# Patient Record
Sex: Female | Born: 1940 | Race: White | Hispanic: No | Marital: Married | Smoking: Former smoker
Health system: Southern US, Community
[De-identification: ages and names within clinical notes are randomized; demographics above are authoritative.]

## PROBLEM LIST (undated history)

## (undated) DIAGNOSIS — I10 Essential (primary) hypertension: Secondary | ICD-10-CM

## (undated) DIAGNOSIS — E039 Hypothyroidism, unspecified: Secondary | ICD-10-CM

## (undated) DIAGNOSIS — I4891 Unspecified atrial fibrillation: Secondary | ICD-10-CM

## (undated) DIAGNOSIS — G459 Transient cerebral ischemic attack, unspecified: Secondary | ICD-10-CM

## (undated) DIAGNOSIS — I739 Peripheral vascular disease, unspecified: Secondary | ICD-10-CM

## (undated) HISTORY — PX: OTHER SURGICAL HISTORY: SHX169

## (undated) HISTORY — PX: THYROIDECTOMY: SHX17

## (undated) HISTORY — PX: BREAST BIOPSY: SHX20

---

## 1976-08-21 HISTORY — PX: TOTAL THYROIDECTOMY: SHX2547

## 1978-08-21 HISTORY — PX: ABDOMINAL HYSTERECTOMY: SHX81

## 1978-08-21 HISTORY — PX: APPENDECTOMY: SHX54

## 1978-08-21 HISTORY — PX: BREAST BIOPSY: SHX20

## 2005-05-04 ENCOUNTER — Ambulatory Visit: Payer: Self-pay | Admitting: Dermatology

## 2005-09-27 ENCOUNTER — Ambulatory Visit: Payer: Self-pay | Admitting: Unknown Physician Specialty

## 2005-10-19 ENCOUNTER — Ambulatory Visit: Payer: Self-pay | Admitting: Unknown Physician Specialty

## 2005-11-15 ENCOUNTER — Ambulatory Visit: Payer: Self-pay | Admitting: General Surgery

## 2008-05-27 ENCOUNTER — Ambulatory Visit: Payer: Self-pay | Admitting: Unknown Physician Specialty

## 2010-06-23 ENCOUNTER — Other Ambulatory Visit: Payer: Self-pay | Admitting: Rheumatology

## 2010-07-07 ENCOUNTER — Ambulatory Visit: Payer: Self-pay | Admitting: Unknown Physician Specialty

## 2011-09-20 ENCOUNTER — Ambulatory Visit: Payer: Self-pay | Admitting: Family Medicine

## 2013-02-26 ENCOUNTER — Ambulatory Visit: Payer: Self-pay | Admitting: Family Medicine

## 2013-02-26 LAB — HM MAMMOGRAPHY

## 2013-06-11 ENCOUNTER — Observation Stay: Payer: Self-pay | Admitting: Internal Medicine

## 2013-06-11 LAB — COMPREHENSIVE METABOLIC PANEL
Albumin: 3.8 g/dL (ref 3.4–5.0)
Alkaline Phosphatase: 83 U/L (ref 50–136)
Anion Gap: 8 (ref 7–16)
BUN: 30 mg/dL — ABNORMAL HIGH (ref 7–18)
Creatinine: 1.81 mg/dL — ABNORMAL HIGH (ref 0.60–1.30)
EGFR (African American): 31 — ABNORMAL LOW
EGFR (Non-African Amer.): 27 — ABNORMAL LOW
Osmolality: 278 (ref 275–301)
Potassium: 3.9 mmol/L (ref 3.5–5.1)
SGPT (ALT): 26 U/L (ref 12–78)
Total Protein: 7.4 g/dL (ref 6.4–8.2)

## 2013-06-11 LAB — CBC
HCT: 43.6 % (ref 35.0–47.0)
MCH: 33.7 pg (ref 26.0–34.0)
MCHC: 34.7 g/dL (ref 32.0–36.0)
MCV: 97 fL (ref 80–100)
Platelet: 279 10*3/uL (ref 150–440)
RBC: 4.49 10*6/uL (ref 3.80–5.20)
WBC: 6.8 10*3/uL (ref 3.6–11.0)

## 2013-06-12 LAB — LIPID PANEL
HDL Cholesterol: 56 mg/dL (ref 40–60)
Triglycerides: 152 mg/dL (ref 0–200)
VLDL Cholesterol, Calc: 30 mg/dL (ref 5–40)

## 2013-06-12 LAB — TSH: Thyroid Stimulating Horm: 0.461 u[IU]/mL

## 2013-08-28 ENCOUNTER — Ambulatory Visit: Payer: Self-pay | Admitting: Internal Medicine

## 2013-09-11 DIAGNOSIS — J449 Chronic obstructive pulmonary disease, unspecified: Secondary | ICD-10-CM | POA: Insufficient documentation

## 2013-09-11 DIAGNOSIS — M109 Gout, unspecified: Secondary | ICD-10-CM | POA: Insufficient documentation

## 2013-09-11 DIAGNOSIS — N189 Chronic kidney disease, unspecified: Secondary | ICD-10-CM | POA: Insufficient documentation

## 2013-09-11 DIAGNOSIS — I38 Endocarditis, valve unspecified: Secondary | ICD-10-CM | POA: Insufficient documentation

## 2013-09-11 DIAGNOSIS — I739 Peripheral vascular disease, unspecified: Secondary | ICD-10-CM | POA: Insufficient documentation

## 2013-09-29 DIAGNOSIS — I482 Chronic atrial fibrillation, unspecified: Secondary | ICD-10-CM | POA: Insufficient documentation

## 2013-11-13 DIAGNOSIS — Z9229 Personal history of other drug therapy: Secondary | ICD-10-CM | POA: Insufficient documentation

## 2013-11-25 DIAGNOSIS — D510 Vitamin B12 deficiency anemia due to intrinsic factor deficiency: Secondary | ICD-10-CM | POA: Insufficient documentation

## 2014-01-05 ENCOUNTER — Encounter: Payer: Self-pay | Admitting: Nurse Practitioner

## 2014-01-19 ENCOUNTER — Encounter: Payer: Self-pay | Admitting: Nurse Practitioner

## 2014-05-11 LAB — BASIC METABOLIC PANEL
BUN: 21 mg/dL (ref 4–21)
CREATININE: 1.6 mg/dL — AB (ref <=1.1)
Glucose: 101 mg/dL
Potassium: 5.2 mmol/L (ref 3.4–5.3)
SODIUM: 140 mmol/L (ref 137–147)

## 2014-05-11 LAB — LIPID PANEL
Cholesterol: 205 mg/dL — AB (ref 0–200)
HDL: 98 mg/dL — AB (ref 35–70)
LDL CALC: 86 mg/dL
Triglycerides: 107 mg/dL (ref 40–160)

## 2014-05-11 LAB — HEPATIC FUNCTION PANEL
ALT: 19 U/L (ref 7–35)
AST: 26 U/L (ref 13–35)

## 2014-05-11 LAB — CBC AND DIFFERENTIAL
HCT: 36 % (ref 36–46)
Hemoglobin: 11.9 g/dL — AB (ref 12.0–16.0)
Platelets: 299 10*3/uL (ref 150–399)
WBC: 5.4 10*3/mL

## 2014-05-11 LAB — TSH: TSH: 8.38 u[IU]/mL — AB (ref <=5.90)

## 2014-05-11 LAB — HEMOGLOBIN A1C: Hgb A1c MFr Bld: 5.3 % (ref 4.0–6.0)

## 2014-07-01 ENCOUNTER — Ambulatory Visit: Payer: Self-pay | Admitting: Family Medicine

## 2014-07-01 LAB — HM DEXA SCAN

## 2014-10-28 DIAGNOSIS — I340 Nonrheumatic mitral (valve) insufficiency: Secondary | ICD-10-CM | POA: Insufficient documentation

## 2014-10-28 DIAGNOSIS — I071 Rheumatic tricuspid insufficiency: Secondary | ICD-10-CM | POA: Insufficient documentation

## 2014-11-12 ENCOUNTER — Inpatient Hospital Stay: Payer: Self-pay | Admitting: Internal Medicine

## 2014-11-12 LAB — URINALYSIS, COMPLETE
Bacteria: NONE SEEN
Bilirubin,UR: NEGATIVE
Blood: NEGATIVE
Glucose,UR: NEGATIVE mg/dL (ref 0–75)
KETONE: NEGATIVE
Leukocyte Esterase: NEGATIVE
Nitrite: NEGATIVE
PH: 7 (ref 4.5–8.0)
Protein: 100
RBC,UR: 1 /HPF (ref 0–5)
SPECIFIC GRAVITY: 1.012 (ref 1.003–1.030)
SQUAMOUS EPITHELIAL: NONE SEEN
WBC UR: 1 /HPF (ref 0–5)

## 2014-11-12 LAB — COMPREHENSIVE METABOLIC PANEL
AST: 23 U/L
Albumin: 3.9 g/dL
Alkaline Phosphatase: 69 U/L
Anion Gap: 10 (ref 7–16)
BUN: 21 mg/dL — AB
Bilirubin,Total: 0.2 mg/dL — ABNORMAL LOW
Calcium, Total: 9 mg/dL
Chloride: 105 mmol/L
Co2: 24 mmol/L
Creatinine: 1.26 mg/dL — ABNORMAL HIGH
EGFR (Non-African Amer.): 42 — ABNORMAL LOW
GFR CALC AF AMER: 48 — AB
Glucose: 129 mg/dL — ABNORMAL HIGH
POTASSIUM: 4.1 mmol/L
SGPT (ALT): 17 U/L
Sodium: 139 mmol/L
TOTAL PROTEIN: 6.8 g/dL

## 2014-11-12 LAB — CBC WITH DIFFERENTIAL/PLATELET
Basophil #: 0.1 10*3/uL (ref 0.0–0.1)
Basophil %: 1.8 %
EOS PCT: 4.6 %
Eosinophil #: 0.3 10*3/uL (ref 0.0–0.7)
HCT: 22.8 % — AB (ref 35.0–47.0)
HGB: 6.7 g/dL — ABNORMAL LOW (ref 12.0–16.0)
LYMPHS ABS: 0.5 10*3/uL — AB (ref 1.0–3.6)
Lymphocyte %: 8.6 %
MCH: 21.2 pg — AB (ref 26.0–34.0)
MCHC: 29.3 g/dL — ABNORMAL LOW (ref 32.0–36.0)
MCV: 72 fL — ABNORMAL LOW (ref 80–100)
MONO ABS: 0.5 x10 3/mm (ref 0.2–0.9)
MONOS PCT: 9 %
NEUTROS ABS: 4.6 10*3/uL (ref 1.4–6.5)
Neutrophil %: 76 %
Platelet: 330 10*3/uL (ref 150–440)
RBC: 3.15 10*6/uL — AB (ref 3.80–5.20)
RDW: 17.5 % — ABNORMAL HIGH (ref 11.5–14.5)
WBC: 6 10*3/uL (ref 3.6–11.0)

## 2014-11-13 LAB — CBC WITH DIFFERENTIAL/PLATELET
BASOS PCT: 1.4 %
Basophil #: 0.1 10*3/uL (ref 0.0–0.1)
EOS PCT: 1.8 %
Eosinophil #: 0.1 10*3/uL (ref 0.0–0.7)
HCT: 23.4 % — ABNORMAL LOW (ref 35.0–47.0)
HGB: 7.2 g/dL — AB (ref 12.0–16.0)
Lymphocyte #: 0.4 10*3/uL — ABNORMAL LOW (ref 1.0–3.6)
Lymphocyte %: 5.3 %
MCH: 21.9 pg — AB (ref 26.0–34.0)
MCHC: 30.7 g/dL — ABNORMAL LOW (ref 32.0–36.0)
MCV: 71 fL — AB (ref 80–100)
MONO ABS: 0.7 x10 3/mm (ref 0.2–0.9)
Monocyte %: 9.7 %
NEUTROS ABS: 6 10*3/uL (ref 1.4–6.5)
Neutrophil %: 81.8 %
Platelet: 287 10*3/uL (ref 150–440)
RBC: 3.29 10*6/uL — AB (ref 3.80–5.20)
RDW: 17.7 % — ABNORMAL HIGH (ref 11.5–14.5)
WBC: 7.3 10*3/uL (ref 3.6–11.0)

## 2014-11-13 LAB — BASIC METABOLIC PANEL
ANION GAP: 7 (ref 7–16)
BUN: 18 mg/dL
CALCIUM: 8.8 mg/dL — AB
CHLORIDE: 105 mmol/L
Co2: 23 mmol/L
Creatinine: 1.26 mg/dL — ABNORMAL HIGH
EGFR (African American): 48 — ABNORMAL LOW
GFR CALC NON AF AMER: 42 — AB
GLUCOSE: 107 mg/dL — AB
Potassium: 3.9 mmol/L
Sodium: 135 mmol/L

## 2014-11-13 LAB — HEMOGLOBIN: HGB: 8 g/dL — AB (ref 12.0–16.0)

## 2014-11-14 LAB — HEMOGLOBIN: HGB: 8 g/dL — ABNORMAL LOW (ref 12.0–16.0)

## 2014-11-15 LAB — CBC WITH DIFFERENTIAL/PLATELET
BASOS ABS: 0 10*3/uL (ref 0.0–0.1)
Basophil %: 0.6 %
EOS ABS: 0 10*3/uL (ref 0.0–0.7)
Eosinophil %: 0 %
HCT: 26.6 % — ABNORMAL LOW (ref 35.0–47.0)
HGB: 8.1 g/dL — ABNORMAL LOW (ref 12.0–16.0)
LYMPHS ABS: 0.4 10*3/uL — AB (ref 1.0–3.6)
LYMPHS PCT: 5.3 %
MCH: 22.3 pg — ABNORMAL LOW (ref 26.0–34.0)
MCHC: 30.5 g/dL — ABNORMAL LOW (ref 32.0–36.0)
MCV: 73 fL — ABNORMAL LOW (ref 80–100)
MONOS PCT: 3.3 %
Monocyte #: 0.2 x10 3/mm (ref 0.2–0.9)
NEUTROS ABS: 6.8 10*3/uL — AB (ref 1.4–6.5)
Neutrophil %: 90.8 %
PLATELETS: 342 10*3/uL (ref 150–440)
RBC: 3.65 10*6/uL — ABNORMAL LOW (ref 3.80–5.20)
RDW: 18.8 % — AB (ref 11.5–14.5)
WBC: 7.5 10*3/uL (ref 3.6–11.0)

## 2014-11-15 LAB — BASIC METABOLIC PANEL
Anion Gap: 10 (ref 7–16)
BUN: 28 mg/dL — AB
CHLORIDE: 101 mmol/L
CREATININE: 1.77 mg/dL — AB
Calcium, Total: 8.7 mg/dL — ABNORMAL LOW
Co2: 21 mmol/L — ABNORMAL LOW
EGFR (African American): 32 — ABNORMAL LOW
EGFR (Non-African Amer.): 28 — ABNORMAL LOW
GLUCOSE: 183 mg/dL — AB
Potassium: 4 mmol/L
SODIUM: 132 mmol/L — AB

## 2014-11-16 LAB — BASIC METABOLIC PANEL
ANION GAP: 8 (ref 7–16)
BUN: 40 mg/dL — AB
CALCIUM: 8.4 mg/dL — AB
CHLORIDE: 104 mmol/L
Co2: 23 mmol/L
Creatinine: 2.31 mg/dL — ABNORMAL HIGH
EGFR (Non-African Amer.): 20 — ABNORMAL LOW
GFR CALC AF AMER: 23 — AB
GLUCOSE: 101 mg/dL — AB
POTASSIUM: 4.1 mmol/L
SODIUM: 135 mmol/L

## 2014-11-16 LAB — CREATININE, SERUM
Creatinine: 2.11 mg/dL — ABNORMAL HIGH
GFR CALC AF AMER: 26 — AB
GFR CALC NON AF AMER: 22 — AB

## 2014-11-16 LAB — HEMOGLOBIN: HGB: 7.8 g/dL — AB (ref 12.0–16.0)

## 2014-11-17 LAB — CULTURE, BLOOD (SINGLE)

## 2014-12-11 NOTE — Consult Note (Signed)
Referring Physician:  Lytle Butte   Primary Care Physician:  [facility], [address] [place], [place] [address], [place] [phone]  Reason for Consult: Admit Date: 11-Jun-2013  Chief Complaint: L facial droop  Reason for Consult: transient neurologic deficit   History of Present Illness: History of Present Illness:   74 yo RHD F presents to [facility] secondary to not feeling right for the past 2 days and headache.  Yesterday she was eating dinner and noted that she could not get her words out.  This lasted only a few seconds but then family noted that she had a L facial droop that lasted for a few hours.  There was no focal weakness, numbness, vision changes or tingling.  No incontinence, no photophobia, no tongue biting and no N/V.  ROS:  General denies complaints   HEENT no complaints   Lungs no complaints   Cardiac no complaints   GI no complaints   GU no complaints   Musculoskeletal no complaints   Extremities no complaints   Skin no complaints   Neuro headache   Endocrine no complaints   Psych no complaints   Past Medical/Surgical Hx:  Hypertension:   Thyroidectomy:   Hysterectomy:   Past Medical/ Surgical Hx:  Past Medical History HTN, HLD, remote migraines, PVD   Past Surgical History as above   Home Medications: Medication Instructions Last Modified Date/Time  Plavix 75 mg oral tablet 1 tab(s) orally once a day 22-Oct-14 22:58  Synthroid  orally  22-Oct-14 22:58  Uloric  orally  22-Oct-14 22:58  hydrochlorothiazide  orally  22-Oct-14 22:58   Allergies:  Allopurinol: Hives  Allergies:  Allergies as above   Social/Family History: Employment Status: retired  Lives With: spouse  Living Arrangements: house  Social History: 2-3 glasses of wine/daily, no tob, no illicits  Family History: no seizures or strokes   Vital Signs: **Vital Signs.:   23-Oct-14 11:45  Vital Signs Type Routine  Temperature  Temperature (F) 97.9  Celsius 36.6  Temperature Source oral  Pulse Pulse 105  Respirations Respirations 18  Systolic BP Systolic BP 644  Diastolic BP (mmHg) Diastolic BP (mmHg) 91  Mean BP 105  Pulse Ox % Pulse Ox % 97  Pulse Ox Activity Level  At rest  Oxygen Delivery Room Air/ 21 %   Physical Exam: General: NAD, resting comfortable, slightly overweight  HEENT: normocephalic, sclera nonicteric, oropharynx clear  Neck: supple, no JVD, no bruits  Chest: CTA B, no wheezing, good movement  Cardiac: RRR, no murmurs, no edema, 2+ pulses  Extremities: no C/C/E, FROM   Neurologic Exam: Mental Status: alert and oriented x 3, normal speech and language, follows complex commands  Cranial Nerves: PERRLA, EOMI, nl VF, face symmetric, tongue midline, shoulder shrug equal  Motor Exam: 5/5 B normal, tone, no tremor  Deep Tendon Reflexes: 2+/4 B, plantars downgoing B, no Hoffman  Sensory Exam: pinprick, temperature, and vibration intact B  Coordination: FTN and HTS WNL, nl RAM, nl gait   Lab Results: Thyroid:  22-Oct-14 21:54   Thyroid Stimulating Hormone 0.461 (0.45-4.50 (International Unit)  ----------------------- Pregnant patients have  different reference  ranges for TSH:  - - - - - - - - - -  Pregnant, first trimetser:  0.36 - 2.50 uIU/mL)  LabObservation:  23-Oct-14 10:25   OBSERVATION Reason for Test  Hepatic:  22-Oct-14 21:54   Bilirubin, Total 0.4  Alkaline Phosphatase 83  SGPT (ALT) 26  SGOT (  AST) 26  Total Protein, Serum 7.4  Albumin, Serum 3.8  Routine Chem:  22-Oct-14 21:54   Glucose, Serum 98  BUN  30  Creatinine (comp)  1.81  Sodium, Serum 136  Potassium, Serum 3.9  Chloride, Serum 105  CO2, Serum 23  Calcium (Total), Serum 9.1  Osmolality (calc) 278  eGFR (African American)  31  eGFR (Non-African American)  27 (eGFR values <78m/min/1.73 m2 may be an indication of chronic kidney disease (CKD). Calculated eGFR is useful in patients with stable renal  function. The eGFR calculation will not be reliable in acutely ill patients when serum creatinine is changing rapidly. It is not useful in  patients on dialysis. The eGFR calculation may not be applicable to patients at the low and high extremes of body sizes, pregnant women, and vegetarians.)  Anion Gap 8  23-Oct-14 05:44   Cholesterol, Serum  202  Triglycerides, Serum 152  HDL (INHOUSE) 56  VLDL Cholesterol Calculated 30  LDL Cholesterol Calculated  116 (Result(s) reported on 12 Jun 2013 at 07:20AM.)  Cardiac:  23-Oct-14 05:44   Troponin I < 0.02 (0.00-0.05 0.05 ng/mL or less: NEGATIVE  Repeat testing in 3-6 hrs  if clinically indicated. >0.05 ng/mL: POTENTIAL  MYOCARDIAL INJURY. Repeat  testing in 3-6 hrs if  clinically indicated. NOTE: An increase or decrease  of 30% or more on serial  testing suggests a  clinically important change)  Routine Hem:  22-Oct-14 21:54   WBC (CBC) 6.8  RBC (CBC) 4.49  Hemoglobin (CBC) 15.1  Hematocrit (CBC) 43.6  Platelet Count (CBC) 279 (Result(s) reported on 11 Jun 2013 at 10:18PM.)  MCV 97  MCH 33.7  MCHC 34.7  RDW 13.5   Radiology Results: [place]    23-Oct-14 00:13, [place] Doppler Bilateral  [place] Doppler Bilateral   REASON FOR EXAM:    cva  COMMENTS:       PROCEDURE: [place] - [place]OT[place] DOPPLER BILATERAL  - Jun 12 2013 12:13AM     RESULT: Carotid Doppler interrogation is performed in the standard   fashion. There is atherosclerotic plaque minimally in the carotid bulb   and proximal internal carotids bilaterally without ulceration. The heart   rate is moderately fast. There does not appear to be a high degree of   stenosis. No plaque ulceration is seen. There is antegrade flow in both   vertebral arteries without flow reversal. The peak systolic velocities   are normal. The internal to common carotid peak systolic velocity ratio   is 0.89 on the right and 0.87 normal left.    IMPRESSION:   1. Correlate for  tachycardia.  2. Minimal scattered atherosclerotic disease. No evidence of   hemodynamically significant stenosis.    Dictation Site: 1        Verified By: GSundra Aland M.D., MD  CT:    22-Oct-14 21:26, CT Head Without Contrast  CT Head Without Contrast   REASON FOR EXAM:    facial droop and confused speech  COMMENTS:       PROCEDURE: CT  - CT HEAD WITHOUT CONTRAST  - Jun 11 2013  9:26PM     RESULT: Noncontrast CT of the brain demonstrates prominence of the   ventricles and sulci consistent with atrophy. There is no intracranial   hemorrhage, mass, mass effect or evolving infarct. The included sinuses   show partial opacification of the ethmoid sinuses with grossly normal   aeration otherwise. The mastoids are clear. The calvarium is  intact.    IMPRESSION:   1. Partial opacification of the ethmoid sinuses.  2. Changes of atrophy diffusely. No definite acute intracranial   abnormality evident.  Dictation Site: 6        Verified By: Sundra Aland, M.D., MD   Radiology Impression: Radiology Impression: CT of brain personally reviewed by me and shows mild white matter changes   Impression/Recommendations: Recommendations:   labs reviewed by me notes reviewed by me   L facial droop-  resolved, etiology is unclear as there are three things on the differential to include TIA, possible seizure, or complicated migraine.  Hx is more consistent with the latter two but pt has multiple stroke risk factors. Atrial fibrillation-  newly discovered, not quite rate controlled continue Plavix for now MRI pending EEG ordered can go ahead and treat BP now as this is not a large infarct if even present agree with anticoagulation for secondary stroke prevention with atrial fibrillation continue stroke protocol will follow  Electronic Signatures: Jamison Neighbor (MD)  (Signed 23-Oct-14 12:29)  Authored: REFERRING PHYSICIAN, Primary Care Physician, Consult, History of Present  Illness, Review of Systems, PAST MEDICAL/SURGICAL HISTORY, HOME MEDICATIONS, ALLERGIES, Social/Family History, NURSING VITAL SIGNS, Physical Exam-, LAB RESULTS, RADIOLOGY RESULTS, Recommendations   Last Updated: 23-Oct-14 12:29 by Jamison Neighbor (MD)

## 2014-12-11 NOTE — H&P (Signed)
PATIENT NAME:  Melissa Bass, Melissa Bass MR#:  [id number] DATE OF BIRTH:  05-22-1941  DATE OF ADMISSION:  06/11/2013  REFERRING PHYSICIAN: Dr. Robet Leu   PRIMARY CARE PHYSICIAN: Dr. Venia Minks   CARDIOLOGIST: Dr. Nehemiah Massed.   CHIEF COMPLAINT: Left-sided facial drooping.   HISTORY OF PRESENT ILLNESS:  This is a 74 year old Caucasian female with past medical history of peripheral vascular disease status post angioplasty on Plavix as well as hypertension, hypothyroidism, who is presenting with acute left-sided facial drooping. Her symptoms occurred while she was at dinner. She noting having difficulty speaking, described as difficulty getting words out, which lasted a few seconds only. This was followed by a left-sided facial droop where she actually had food falling out of her mouth while attempting to eat dinner. With the symptoms, she decided to present to [facility] for further work-up and evaluation. On arrival, NIH stroke scale was 1, though of note, she was also found to be in atrial fibrillation, which is new onset. She denies any palpitations, chest pain or shortness of breath. Currently, she states that her symptoms have essentially resolved.    REVIEW OF SYSTEMS: CONSTITUTIONAL: Denies any fevers, fatigue, weakness, pain, weight changes.  EYES: Denies any blurred vision, double vision or eye pain.  ENT: Denies any tinnitus, ear pain or dysphagia.  RESPIRATORY: Denies cough, shortness of breath or wheeze.  CARDIOVASCULAR: Denies chest pain, palpitations, or edema.  GASTROINTESTINAL: Denies nausea, vomiting or diarrhea.  GENITOURINARY: Denies dysuria, hematuria.  ENDOCRINE: Denies nocturia or thyroid problems.  HEMATOLOGIC/LYMPHATIC:  Denies easy bruising or bleeding.  SKIN: Denies any rashes, lesions.  MUSCULOSKELETAL: Denies any pain in her neck, back, shoulders, knees, and any arthritic symptoms.   NEUROLOGIC: Left-sided facial droop as above, also noted left-sided perioral numbness,  which has now resolved as well as dysarthria, which has now resolved.  PSYCHIATRIC: Denies any anxiety or depressive symptoms.  Otherwise, full view system performed by me is negative.   PAST MEDICAL HISTORY: All peripheral vascular disease requiring angioplasty. She is currently on Plavix, hypertension, hypothyroidism.   FAMILY HISTORY: She denies any members of her family with previous strokes.   SOCIAL HISTORY: Remote tobacco use quit approximately 17 years ago. Social alcohol use. Denies any drug usage.   ALLERGIES: ALLOPURINOL CAUSING A RASH.   HOME MEDICATIONS: Hydrochlorothiazide of unknown dosage, Synthroid of unknown dosage,  Plavix 75 mg p.o. daily.   PHYSICAL EXAMINATION: VITAL SIGNS: Temperature 98.7, heart rate 112, respirations 22, blood pressure 183/120, pulse oximetry 96% on room air.  GENERAL: Well-nourished, well-developed Caucasian female in no acute distress.  HEAD: Normocephalic, atraumatic.  EYES: Pupils equal, round, and reactive to light. Extraocular muscles intact. No scleral icterus.  MOUTH: Moist mucosal membranes. Dentition intact. No abscess noted.  EARS, NOSE, THROAT: Throat clear without exudates. No external lesions.  NECK: Supple. No thyromegaly. No nodules. No JVD.  PULMONARY: Clear to auscultation bilaterally without wheezes, rales or rhonchi. No use of accessory muscles. Good respiratory effort.  CHEST: Nontender to palpation.  CARDIOVASCULAR: S1, S2, irregular rate, irregular rhythm consistent with atrial fibrillation. No murmurs, rubs, or gallops. No peripheral edema. Pedal pulses 2+.  GASTROINTESTINAL: Soft, nontender, nondistended. No masses. Positive bowel sounds. No hepatosplenomegaly.  MUSCULOSKELETAL: No swelling, clubbing, edema. Range of motion is full in all extremities.  NEUROLOGIC: Cranial nerves II through XII intact. Sensation intact. Reflexes intact. Strength 5/5 in all muscle groups. Pronator drift was normal. Gait was not tested at this  time.   SKIN: No ulcerations, lesions, rashes or  cyanosis. Skin is warm and dry. Turgor is intact.  PSYCHIATRIC: Mood and affect within normal limits. She is awake, alert, oriented x 3. Insight and judgment are intact.   LABORATORY DATA: Sodium 136, potassium 3.9, chloride 105, bicarbonate 23, BUN 30, creatinine 1.87, glucose 98, troponin I less than 0.02. WBC 6.8, hemoglobin 15.1, platelets of 279.  EKG: Atrial fibrillation, heart rate 120.   CT head revealing no acute intracranial process.   ASSESSMENT AND PLAN: A 74 year old female with history of hypertension, peripheral vascular disease, hypothyroidism presenting with acute onset of left facial droop.  1. Transient ischemic attack. Symptoms currently resolved. Risk factors for hypertension and  vascular disease on Plavix. Neuro checks every 4 hours, check lipid, carotid Doppler, MRI. She passed bedside swallow already, so she can eat,  give aspirin and statin.  2.  New onset atrial fibrillation. We will check TSH,  transthoracic echocardiogram, trend cardiac enzymes. We will consult cardiology with transient ischemic attack-like symptoms.  CHADS score is 3. She is already on Plavix and she will likely need more formal anticoagulation.  We will defer to her cardiologist.  3.  Hypertension. She does not know her home medications and we will verify them in the morning. We will hold her antihypertensive medication given her acute transient ischemic attack symptoms, unless blood pressure greater than 220/120. At that time, we will treat with hydralazine.  4.  Hypothyroidism. Continue Synthroid, check TSH.  5.  Peripheral vascular disease. Continue Plavix.  6.  Deep venous thrombosis prophylaxis on heparin subcutaneous.   CODE STATUS: The patient is full code.   TIME SPENT: 45 minutes    ____________________________ Aaron Mose. Oracio Galen, MD dkh:cc D: 06/11/2013 67:54:49 ET T: 06/12/2013 00:28:47 ET JOB#: [id number]  cc: Aaron Mose. Novah Nessel, MD,  <Dictator> Eliska Hamil Woodfin Ganja MD ELECTRONICALLY SIGNED 06/12/2013 4:15

## 2014-12-11 NOTE — Discharge Summary (Signed)
PATIENT NAME:  Melissa Bass, Melissa Bass MR#:  [id number] DATE OF BIRTH:  [birth year]  DATE OF ADMISSION:  06/11/2013 DATE OF DISCHARGE:  06/12/2013  DISCHARGE DIAGNOSES:  1.  Acute nonhemorrhagic, punctate, right parietal lobe infarct, confirmed on MRI, likely causing transient left-sided facial droop, which has resolved.  2.  New-onset atrial fibrillation, on Cardizem for rate control and Eliquis and Plavix, bleeding risk and benefit explained to the patient.   SECONDARY DIAGNOSES:  1.  Peripheral vascular disease.  2.  Hypertension. 3.  Hypothyroidism.   CONSULTATIONS: 1.  Dr. Valora Corporal.  2.  Cardiology, Dr. Serafina Royals.   PROCEDURES AND RADIOLOGY: MRI of the brain on the 23rd of October showed acute nonhemorrhagic, tiny, punctate right parietal lobe infarct.   A 2-D echo on the 23rd of October showed normal LV systolic function with ejection fraction of 60% to 65%. Moderately dilated left and right atrium. Moderate mitral valve regurgitation. Moderate to severe tricuspid regurgitation.   CT scan of the head without contrast on the 22nd of October showed partial opacification of the ethmoid sinuses. No definite acute intracranial abnormality. Diffuse atrophy seen.   Bilateral carotid Dopplers on the 23rd of October showed no hemodynamically significant stenosis.   HISTORY AND SHORT HOSPITAL COURSE: The patient is a 74 year old female with the above-mentioned medical problems, who was admitted for left-sided facial droop, worrisome for transient ischemic attack. Please see Dr. Ardith Dark dictated history and physical for further details. Neurological consultation was obtained with Dr. Valora Corporal, who recommended MRI of the brain with 2-D echo, which was performed, showing acute infarct.  The patient was also found to be in new onset of atrial fibrillation, for which cardiology consultation was obtained with Dr. Serafina Royals, who recommended continuing Plavix and starting her on  Eliquis for anticoagulation. He also recommended 2-D echo, which was performed with results dictated above.   As per recommendation from neurology, EEG was performed on the 23rd of October, and was within normal limits. After discussion with cardiology and neurology, the patient was deemed stable to be discharged home on the 23rd of October. On the date of discharge, her vital signs were as follows: Temperature 97.9, heart 100 per minute, respirations 18 per minute, blood pressure 135/91 mmHg.  She was saturating 97% on room air.   PERTINENT PHYSICAL EXAMINATION ON THE DATE OF DISCHARGE:  CARDIOVASCULAR: Irregularly irregular heart sounds. No murmurs, rubs, or gallop.  ABDOMEN: Soft, benign.  NEUROLOGIC: Nonfocal examination.  LUNGS: Clear to auscultation bilaterally. No wheezing, rales, rhonchi or crepitation.  ABDOMEN: Soft and benign.  All other physical examination remained at baseline.   DISCHARGE MEDICATIONS:  1.  Plavix 75 mg p.o. daily.  2.  Synthroid at home dosage. 3.  Uloric at home dosage. 4.  Hydrochlorothiazide as per the home dosage. 5.  Atorvastatin 20 mg p.o. daily.  6.  Eliquis 5 mg p.o. b.i.d.  7.  Cardizem CD 120 mg p.o. daily.   DISCHARGE DIET: Low sodium, low fat, low cholesterol.   DISCHARGE ACTIVITY: As tolerated.   DISCHARGE INSTRUCTIONS AND FOLLOW-UP: The patient was instructed to use both Plavix and Eliquis for now, until she follows up with her primary care physician or cardiology, as she will need to have a somewhat overlap until Eliquis effect starts on the body.  Plavix can be stopped eventually, once Eliquis starts working.   The patient will need follow-up with Dr. Margarita Rana in 1 to 2 weeks, and [facility] neurology in 2 to  4 weeks.   TOTAL TIME DISCHARGING THIS PATIENT: 45 minutes.     ____________________________ Lucina Mellow. Manuella Ghazi, MD vss:cg D: 06/15/2013 23:51:34 ET T: 06/16/2013 04:09:13 ET JOB#: [id number]  cc: Acel Natzke S. Manuella Ghazi, MD,  <Dictator> Jerrell Belfast, MD [facility] Neurology Mila Homer. Tamala Julian, MD Corey Skains, MD  Lucina Mellow [facility] MD ELECTRONICALLY SIGNED 06/18/2013 11:28

## 2014-12-11 NOTE — Consult Note (Signed)
General Aspect 74 year old female that developed strokelike symptoms last night at dinnertime involving garbled speech and drooping of the left side of the mouth that lasted just a few minutes with complete resolution. She was also found to be in new onset atrial fibrillation with RVR.  She was unaware of any arrhythmia.  She denies being more short of breath over the last few days or any chest pain.  She did have some sinus congestion and dysuria and has been taking decongestants as well as pyridium over-the-counter.  Her blood pressure initially on arrival was high at 193/106.  Blood pressure medications are being held at this time due to stroke symptoms.  She did have a CT of the head which showed atrophy but no acute changes.  She does have a chads2 score of 3 and is on Plavix and aspirin currently for history of   peripheral vascular disease involving the right thigh.  She will have to be considered for a novel anticoagulant with the Plavix being discontinued when it's safe to make the change over, willl probably continue the aspirin. She is currently on heparin subcutaneous. She does have some mild renal sufficiency with a creatinine of 1.81 GFR 27.She is on thyroid replacement,  TSH is normal in the hospital.  Troponins have been negative.  EKG is negative for acute changes.  She did smoke but quit a year ago.She will also need rate control when it's safe to start following Stroke symptoms. Echocardiogram is pending.   Physical Exam:  GEN well developed, no acute distress   HEENT pink conjunctivae, hearing intact to voice, moist oral mucosa   NECK supple   RESP normal resp effort  clear BS   CARD Irregular rate and rhythm  Normal, S1, S2  RVR   ABD denies tenderness  soft   EXTR negative edema   SKIN skin turgor decreased   NEURO cranial nerves intact, motor/sensory function intact   PSYCH alert, A+O to time, place, person   Review of Systems:  Subjective/Chief Complaint Garbled  speech with drooping of the left side of the mouth resolved, new onset A. fib   General: No Complaints   Skin: No Complaints   ENT: Sinus congestion for which she was taking over-the-counter decongestants   Eyes: No Complaints   Neck: No Complaints   Respiratory: No Complaints   Cardiovascular: No Complaints   Gastrointestinal: No Complaints   Genitourinary: Recent dysuria for which he took over-the-counter Pyridium   Vascular: No Complaints   Musculoskeletal: No Complaints   Neurologic: No Complaints   Hematologic: No Complaints   Endocrine: No Complaints  On thyroid replacement   Psychiatric: No Complaints   Medications/Allergies Reviewed Medications/Allergies reviewed   Lab Results: Thyroid:  22-Oct-14 21:54   Thyroid Stimulating Hormone 0.461 (0.45-4.50 (International Unit)  ----------------------- Pregnant patients have  different reference  ranges for TSH:  - - - - - - - - - -  Pregnant, first trimetser:  0.36 - 2.50 uIU/mL)  Hepatic:  22-Oct-14 21:54   Bilirubin, Total 0.4  Alkaline Phosphatase 83  SGPT (ALT) 26  SGOT (AST) 26  Total Protein, Serum 7.4  Albumin, Serum 3.8  Routine Chem:  22-Oct-14 21:54   Glucose, Serum 98  BUN  30  Creatinine (comp)  1.81  Sodium, Serum 136  Potassium, Serum 3.9  Chloride, Serum 105  CO2, Serum 23  Calcium (Total), Serum 9.1  Osmolality (calc) 278  eGFR (African American)  31  eGFR (Non-African American)  27 (eGFR values <66m/min/1.73 m2 may be an indication of chronic kidney disease (CKD). Calculated eGFR is useful in patients with stable renal function. The eGFR calculation will not be reliable in acutely ill patients when serum creatinine is changing rapidly. It is not useful in  patients on dialysis. The eGFR calculation may not be applicable to patients at the low and high extremes of body sizes, pregnant women, and vegetarians.)  Anion Gap 8  Cardiac:  22-Oct-14 21:54   Troponin I < 0.02  (0.00-0.05 0.05 ng/mL or less: NEGATIVE  Repeat testing in 3-6 hrs  if clinically indicated. >0.05 ng/mL: POTENTIAL  MYOCARDIAL INJURY. Repeat  testing in 3-6 hrs if  clinically indicated. NOTE: An increase or decrease  of 30% or more on serial  testing suggests a  clinically important change)  Routine Hem:  22-Oct-14 21:54   WBC (CBC) 6.8  RBC (CBC) 4.49  Hemoglobin (CBC) 15.1  Hematocrit (CBC) 43.6  Platelet Count (CBC) 279 (Result(s) reported on 11 Jun 2013 at 10:18PM.)  MCV 97  MCH 33.7  MCHC 34.7  RDW 13.5   Radiology Results: [place]    23-Oct-14 00:13, [place]  [place] Bilateral   REASON FOR EXAM:    cva  COMMENTS:       PROCEDURE: [place] - [place] DOPPLER BILATERAL  - Jun 12 2013 12:13AM     RESULT: Carotid Doppler interrogation is performed in the standard   fashion. There is atherosclerotic plaque minimally in the carotid bulb   and proximal internal carotids bilaterally without ulceration. The heart   rate is moderately fast. There does not appear to be a high degree of   stenosis. No plaque ulceration is seen. There is antegrade flow in both   vertebral arteries without flow reversal. The peak systolic velocities   are normal. The internal to common carotid peak systolic velocity ratio   is 0.89 on the right and 0.87 normal left.    IMPRESSION:   1. Correlate for tachycardia.  2. Minimal scattered atherosclerotic disease. No evidence of   hemodynamically significant stenosis.    Dictation Site: 1        Verified By: GSundra Aland M.D., MD  CT:    22-Oct-14 21:26, CT Head Without Contrast  CT Head Without Contrast   REASON FOR EXAM:    facial droop and confused speech  COMMENTS:       PROCEDURE: CT  - CT HEAD WITHOUT CONTRAST  - Jun 11 2013  9:26PM     RESULT: Noncontrast CT of the brain demonstrates prominence of the   ventricles and sulci consistent with atrophy. There is no intracranial   hemorrhage, mass,  mass effect or evolving infarct. The included sinuses   show partial opacification of the ethmoid sinuses with grossly normal   aeration otherwise. The mastoids are clear. The calvarium is intact.    IMPRESSION:   1. Partial opacification of the ethmoid sinuses.  2. Changes of atrophy diffusely. No definite acute intracranial   abnormality evident.  Dictation Site: 6        Verified By: GSundra Aland M.D., MD    Allopurinol: Hives  Vital Signs/Nurse's Notes: **Vital Signs.:   23-Oct-14 07:57  Vital Signs Type Routine  Temperature Temperature (F) 97.9  Celsius 36.6  Temperature Source oral  Pulse Pulse 106  Respirations Respirations 18  Systolic BP Systolic BP 1277 Diastolic BP (mmHg) Diastolic BP (mmHg) 1412 Mean BP  120  Pulse Ox % Pulse Ox % 96  Pulse Ox Activity Level  At rest  Oxygen Delivery Room Air/ 21 %    Impression 74 year old female with new A. fib with RVR and TIA symptoms, with TIA symptoms currently resolved.   Plan 1.  After the appropriate period of time after TIA symptoms, to avoid drop in blood pressure,consider starting rate control for new onset A. fib with heart rate currently running in the 120s .Continue telemetry. 2.  When appropriate,  consideration for stopping Plavix, continuing aspirin due to history of PVD and starting patient on Eliquis due to a chads2 risk of 3  For now, she is  on  heparin subcutaneous.  She does have renal insufficiency but her weight and age do not fit criteria to reduce the dose, so  appropriate dose will be   5 mg twice a day. 3.  Patient was told to avoid over-the-counter decongestants due to elevation of blood pressure and possibly aggravation of atrial fibrillation. 4.  Echocardiogram is pending. 5.Consideration for obtaining UA since patient had dysuria and sought over the counter medication prior to admission to the hospital. 6.  Further clinical decisions per Dr. Nehemiah Massed.   Electronic Signatures: Roderic Palau (NP)  (Signed 23-Oct-14 11:11)  Authored: General Aspect/Present Illness, History and Physical Exam, Review of System, Labs, Radiology, Allergies, Vital Signs/Nurse's Notes, Impression/Plan   Last Updated: 23-Oct-14 11:11 by Roderic Palau (NP)

## 2014-12-14 DIAGNOSIS — I10 Essential (primary) hypertension: Secondary | ICD-10-CM | POA: Insufficient documentation

## 2014-12-20 NOTE — H&P (Signed)
PATIENT NAME:  Melissa Bass, Melissa Bass MR#:  [id number] DATE OF BIRTH:  Jan 15, 1941  DATE OF ADMISSION:  11/12/2014  PRIMARY CARE PHYSICIAN:  Margarita Rana, MD   PRIMARY CARDIOLOGY: Corey Skains, MD   PULMONOLOGIST:  Erby Pian, MD   REFERRING EMERGENCY ROOM PHYSICIAN:  Latina Craver, MD   CHIEF COMPLAINT: Shortness of breath and weakness.   HISTORY OF PRESENT ILLNESS: This is a 74 year old female who has history of peripheral vascular disease, atrial fibrillation and TIA on Coumadin, also has hypertension and hypothyroidism. For the last 2 to 3 weeks she had complained of shortness of breath and feeling more weak, so she went and saw Dr. Nehemiah Massed in the office. He referred her to Dr. Raul Del for further workup and Dr. Raul Del, did a pulmonary function test 3 days ago, but still was not reported yet.   Today, she had a followup appointment with Dr. Nehemiah Massed and he did the blood work, and her hemoglobin was found to be 6.7, so she was sent to Emergency Room for further work-up. As per the patient and our previous record, her hemoglobin was normal in the past and on further questioning, she denies noticing any blood in the stool, any vomiting blood or any abdominal pain or episode of diarrhea. She denies taking any pain medications or denies any similar episode in the past. She had colonoscopy done 3 years ago  which was completely normal. Her stool guaiac was positive by ER physician and so he started her on 1 unit of blood transfusion and given as admission to hospitalist team.   of that also found having some infiltrate on the chest x-ray and so he started on Levaquin antibiotic; patient denies any complaint of cough or shortness of breath.   REVIEW OF SYSTEMS: CONSTITUTIONAL: Negative for fever, fatigue; but positive for generalized weakness, no weight gain or weight loss.  EYES: No blurring, double vision, discharge or redness.  EARS, NOSE, THROAT: No tinnitus, ear pain, or hearing loss.   RESPIRATORY: No cough, wheezing, but feels short of breath.  CARDIOVASCULAR: No chest pain, orthopnea, edema, arrhythmia, palpitations.  GASTROINTESTINAL: No nausea, vomiting, diarrhea, abdominal pain.  GENITOURINARY:  No dysuria, hematuria, increased frequency.  ENDOCRINE: No heat or cold intolerance, no excessive sweating.  SKIN: No acne, rashes, or lesions.  MUSCULOSKELETAL: No pain or swelling in the joints.  NEUROLOGICAL: No numbness, weakness, tremor, or vertigo.  PSYCHIATRIC: No anxiety, insomnia, bipolar disorder.   PAST MEDICAL HISTORY: 1.  Peripheral vascular disease requiring angioplasty in the past.  2.  Hypertension.  3.  Hypothyroidism.  4. Atrial fibrillation, required cardioversion; she was on amiodarone, but stopped 3 months ago, also taking Eliquis till now.  5.  TIA.   FAMILY HISTORY: Positive for mother and father both had stroke and a sister had breast cancer.   SOCIAL HISTORY: She had a remote tobacco use. She quit using almost 18 to 19 years ago.  She drinks 1 glass of wine every day, but not a heavy  alcoholic and denies any illegal drug use. She is not using any support to walk at home.   MEDICATIONS:  At home:  1.  Uloric 80 mg oral tablet once a day in the morning.   3.  Synthroid 150 mcg oral once a day.  4.  Metoprolol succinate 50 mg oral extended release take 1/2 tablet once a day.  5.  Magnesium oxide 1 tablet once a day.  6.  Fluticasone nasal spray 50 mcg  inhalation once a day.  7.  Cyanocobalamin 1000 mcg oral 2 tablets once a day in the morning.  8.  Atorvastatin 20 mg oral once a day.   VITAL SIGNS: In ER, temperature 98, pulse 84, respirations 20, blood pressure 182/84, which went up to 214/98, and pulse oximetry is 96% on room air.   PHYSICAL EXAMINATION:  GENERAL: The patient is fully alert and oriented to time, place, and person, does not appear in acute distress.  HEENT: Head and neck atraumatic. Conjunctivae pale, sclerae anicteric,  pupils equally reactive to light, oral mucosa moist.  NECK: Supple. No JVD, thyroid nontender.  RESPIRATORY: Bilateral equal air entry, mild crepitation.  CARDIOVASCULAR: S1, S2 present, regular, no murmur.  ABDOMEN: Soft, no tenderness, bowel sounds present, no organomegaly felt.  SKIN: No acne, rashes, or lesions.  MUSCULOSKELETAL: No pain or swelling in the joints.  LEGS: No edema.  NEUROLOGICAL: Power 5 out of 5, and follows all commands, moves all 4 limbs, sensation is intact.  PSYCHIATRIC: Does not appear in any acute psychiatric illness at this time.   IMPORTANT DIAGNOSTIC DATA: 1.  Glucose 129, BUN 21, creatinine 1.26, sodium is 139, potassium 4.1, chloride 105, CO2 of 24, calcium is 9; total protein is 6.8, albumin 3.9, bilirubin 0.2, alkaline phosphate 69, SGOT 23, SGPT 17.  2.  WBC is 6, hemoglobin 6.7, platelet count 330,000, MCV is 72.  3.  Urinalysis is grossly negative.  4.  Chest x-ray, portable, shows changes consistent with a right lower lobe infiltrate.   ASSESSMENT AND PLAN: A 74 year old female who came to the Emergency Room with complaint of progressive shortness of breath and weakness for the last 3 weeks and sent by Dr. Nehemiah Massed, after found having anemia in the office.  1.  Symptomatic anemia. This is secondary to GI bleed and blood transfusion started by ER physician. We will continue that and follow hemoglobin every 8 hours and do as needed. She was not taking any pain medication nor had any history of family members having colon cancer. She had a regular colonoscopy 74 years old. Most likely this is bleed due to Eliquis. We will hold Eliquis for now and continue watching. Maybe she might not be a good candidate in the future also for any kind of long-term anticoagulation. Leave it up to cardiologist. GI consult is called in.  2.  Gastrointestinal bleed. Management as mentioned above. We will also give her a liquid diet and Protonix twice daily for now.  3.  Uncontrolled  hypertension. We will continue metoprolol as she is taking at home but we will give 1 injection  and then hydralazine injection as needed for better control of her high blood pressure.  4.  Infiltrate on chest x-ray. Most likely this is pneumonia. The patient does not have any symptoms of cough or fever. White cell count is normal but ER physician gave 1 dose of Levaquin. Because of the patient's complaint of shortness of breath and weakness, we would just finish 5 days of Levaquin course.  5.  Hyperlipidemia. She is taking, atorvastatin. We will continue the same.  6.  Hypothyroidism. Continue levothyroxine as she is taking at home.  7.  CODE STATUS: Full code.   Total Time Spent ON this admission is 50 minutes.    ____________________________ Ceasar Lund Anselm Jungling, MD vgv:nt D: 11/12/2014 17:42:28 ET T: 11/12/2014 19:24:21 ET JOB#: [id number]  cc: Ceasar Lund. Anselm Jungling, MD, <Dictator> Jerrell Belfast, MD Rosalio Macadamia [facility] MD ELECTRONICALLY SIGNED 11/26/2014 0:30

## 2014-12-20 NOTE — Consult Note (Signed)
PATIENT NAME:  Melissa Bass, Melissa Bass MR#:  [id number] DATE OF BIRTH:  Dec 29, 1940  DATE OF CONSULTATION:  11/12/2014  REFERRING PHYSICIAN:  Ceasar Lund. Anselm Jungling, MD CONSULTING PHYSICIAN:  Lollie Sails, MD  GASTROENTEROLOGY CONSULTATION    REASON FOR CONSULTATION: Symptomatic anemia, Hemoccult positive stool.   HISTORY OF PRESENT ILLNESS: Melissa Bass is a pleasant 74 year old Caucasian female who states that she was sent to the hospital by her cardiologist. She states that about 3 weeks ago she began to develop some shortness of breath. This was progressive over the period of time. She did have some pulmonary function tests done at [facility] in this interim. She then saw Dr. Nehemiah Massed for followup. Routine laboratories showed her to have decreased hemoglobin. She has been on Eliquis as a blood thinner in regard to her history of atrial fibrillation. She has been on amiodarone. The Eliquis was stopped today and she has been off the amiodarone for about 3 weeks. The patient denies any nausea, vomiting, or abdominal pain. There is no heartburn or dysphagia. She has been having a daily bowel movement. There is no black stool or blood in the stool. She has noted some mucus in the stool. She rarely takes nonsteroidal anti-inflammatories. There is no previous history of peptic ulcer disease and she believes she has never had an endoscopy. Her last colonoscopy was about 3 years ago by Dr. Dionne Milo. I do not have access to that chart this evening. She states that she does not know if she had polyps.   GASTROINTESTINAL FAMILY HISTORY: Pertinent for a brother with colon cancer. Negative family history of liver disease or ulcers.   PAST MEDICAL HISTORY:  1.  Peripheral vascular disease with angioplasty.  2.  Hypothyroidism.  3.  Hypertension.  4.  History of cardioversion of atrial fibrillation.  5.  History of TIA.   SOCIAL HISTORY: She quit smoking about 20 years ago. She has a glass of wine daily.    OUTPATIENT MEDICATIONS: Uloric 80 mg once a day, telmisartan 80 mg once a day, Synthroid 150 mcg daily, metoprolol 50 mg 1/2 tablet once a day, magnesium oxide pill, fluticasone nasal spray once a day, vitamin, B12 oral tablet two 1000 mcg daily, atorvastatin 20 mg once a day. She was also taking Eliquis up until this morning.   ALLERGIES: SHE IS ALLERGIC TO ALLOPURINOL, CARDIZEM, AND CRESTOR.   REVIEW OF SYSTEMS: Ten systems reviewed per admission history and physical, agree with same.   PHYSICAL EXAMINATION:  VITAL SIGNS: Temperature is 98.1; pulse 94; respirations 18; blood pressure was 210/101, repeat showed this at 193/84, then down to 195/93.  GENERAL: She is a 74 year old Caucasian female in no acute distress.  HEENT: Normocephalic, atraumatic. Eyes are anicteric. Nose: Septum midline. Oropharynx: No lesions.  NECK: No JVD.  HEART: Regular rate and rhythm.  LUNGS: Clear.  ABDOMEN: Soft, nontender, nondistended. Bowel sounds positive, normoactive.  RECTAL: Anorectal exam deferred (Hemoccult positive in the Emergency Room).  EXTREMITIES: No clubbing, cyanosis, or edema.  NEUROLOGICAL: Cranial nerves II-XII grossly intact. Muscle strength bilaterally equal and symmetric.   LABORATORY DATA: Include the following: She had a glucose this morning of 129, BUN 21 creatinine 1.26, sodium 139, potassium 4.1, chloride 105, bicarbonate 24, calcium 9.0. Hepatic profile normal. Hemogram showing a white count of 6.0, hemoglobin and hematocrit 6.7 and 22.8, platelet count 330,000. She has since been transfused 2 units of packed red cells. Her MCV was 72. She has had blood cultures no growth. Urinalysis showing  100 mg/dL protein, negative nitrite. She had a portable chest film showing a right lower lobe infiltrate, possible pneumonia.   ASSESSMENT:  1.  Microcytic anemia while taking a blood thinner.  2.  Right lower lobe pneumonia currently on intravenous antibiotic.  3.  Shortness of breath,  multifactorial over the past several weeks.   RECOMMENDATIONS: Agree with need for luminal evaluation via EGD and colonoscopy; however, the patient is currently being treated for respiratory infection/pneumonia. She is hemodynamically stable. She has been transfused. Would recommend completing her course of antibiotics prior to her sedated procedures. Would also consider giving her another unit of packed cells in the interim. She can followup as an outpatient in GI and we will try to get her procedures done within the next 3 weeks status post completion of treatment for her respiratory infection.    ____________________________ Lollie Sails, MD mus:bm D: 11/12/2014 23:11:45 ET T: 11/12/2014 23:50:02 ET JOB#: [id number]  cc: Lollie Sails, MD, <Dictator> Lollie Sails MD ELECTRONICALLY SIGNED 12/01/2014 14:26

## 2014-12-20 NOTE — Consult Note (Signed)
Brief Consult Note: Diagnosis: heme positive microcytic anemia.   Patient was seen by consultant.   Consult note dictated.   Recommend to proceed with surgery or procedure.   Comments: Please see full GI consult [phone].  Paitetn presenting to o/p cardiologist with progressive sob, admitted with finding of anemia, heme positive stool.  Denies andy GI sx including n/v, abd pain bowel habit changes, black or bloody stools.  Has been on eliquis until today.  Currently being treated for PNA/URTI.  Recommend egd and colonoscopy, place on daily PPI, hold anticoagulant, Can do proceedures as outpatient.  Should have completed tx for PNA/URTI before doing sedated proceedure.  If she is still in hospital on monday, could do proceedures (off eliquis for long enough) if cxr is clear and sob resolved.  Dr Allen Norris available over the weekend if needed.  Electronic Signatures: Loistine Simas (MD)  (Signed 24-Mar-16 23:18)  Authored: Brief Consult Note   Last Updated: 24-Mar-16 23:18 by Loistine Simas (MD)

## 2014-12-20 NOTE — Discharge Summary (Signed)
PATIENT NAME:  Melissa Bass, LAUGHMAN MR#:  [id number] DATE OF BIRTH:  Sep 06, 1940  DATE OF ADMISSION:  11/12/2014 DATE OF DISCHARGE:  11/16/2014  HISTORY OF PRESENT ILLNESS:  For a detailed note, please take a look at the history and physical done on admission by Dr. Anselm Jungling.   DIAGNOSES AT DISCHARGE: Acute respiratory failure with hypoxia, pneumonia, chronic obstructive pulmonary disease, emphysema, acute on chronic renal failure, gastrointestinal bleed, hypertension.  DIET:  Patient is being discharged on a low sodium, low fat diet.  ACTIVITY: As tolerated.   FOLLOWUP:  Dr. Margarita Rana in the next 1 to 2 days. Also follow up with Dr. Gustavo Lah in 1 week.   DISCHARGE MEDICATIONS: Atorvastatin 20 mg daily, Synthroid 150 mcg daily, losartan 80 mg daily, Uloric 80 mg daily, Flonase 1 spray to each nostril daily, vitamin B12 1000 mcg 2 tabs daily, magnesium oxide 1 tablet daily, metoprolol succinate 100 mg daily, Symbicort 2 puffs b.i.d., Spiriva 1 puff daily, albuterol inhaler 2 puffs 4 times daily as needed, levofloxacin 250 mg q. 48 hours x 10 days.   [place] COURSE: Dr. Loistine Simas from gastroenterology.   PERTINENT STUDIES DONE DURING THE HOSPITAL COURSE:  A chest x-ray done on admission showing chronic changes consistent with a right lower lobe infiltrate, persistent lower lobe pneumonia right worse than left. No dense consolidation or lobar collapse. No progression or new finding. A CT chest done without contrast showing advanced emphysema, right lower lobe bronchopneumonia.   HOSPITAL COURSE: This is a 74 year old female with medical problems as mentioned above, presented to the hospital on 11/12/2014 due to shortness of breath and feeling weak and noted to be anemic and also noted to be hypoxic.  1.  Acute respiratory failure with hypoxia. This was thought to be multifactorial in nature, probably related to patient's symptomatic anemia combined with possible  underlying pneumonia as seen on the chest x-ray and CAT scan. The patient was transfused while in the hospital. Hemoglobin has improved post transfusion and has remained stable. Also started on IV antibiotics for pneumonia. Maintained on treatment for COPD with the Symbicort, Spiriva, and some DuoNebs. The patient has clinically improved. She did desaturate on ambulation, therefore was arranged for home oxygen prior to discharge. She will continue 10 day every other day course of Levaquin for her pneumonia. 2.  Pneumonia. This was likely possibly contributing to her acute respiratory failure and hypoxia. The patient blood cultures remained negative. She is being discharged on oral Levaquin as stated. She is afebrile and hemodynamically stable.  3.  Acute on chronic renal failure. This was likely secondary to some over-diuresis she received.  Initially, when she had respiratory failure she thought she was in congestive heart failure. She was therefore diuresed.  This led to her kidney function rising. Her kidney function after getting some fluids has started to trend up and further needs to be followed as an outpatient.  4.  Acute on chronic GI bleed. The patient had no acute bleeding while in the hospital. Her hemoglobin remained stable. She was seen by gastroenterology. They did not recommend acute intervention given her pneumonia, therefore recommended follow up as an outpatient and an endoscopy in the near future.  Since her hemoglobin has remained stable with no acute bleeding, she will finish treatment for her pneumonia and follow up with GI as an outpatient for work-up of her GI bleed.  The patient is being discharged off any anticoagulants presently.   CODE STATUS: The patient  is a full code.   DISPOSITION: She was discharged home.   TIME SPENT: 40 minutes.   ____________________________ Belia Heman. Verdell Carmine, MD vjs:sp D: 11/17/2014 16:40:12 ET T: 11/17/2014 17:51:54 ET JOB#: [id number]  cc: Belia Heman. Verdell Carmine, MD, <Dictator> Jerrell Belfast, MD Lollie Sails, MD Henreitta Leber MD ELECTRONICALLY SIGNED 11/26/2014 16:19

## 2015-01-01 ENCOUNTER — Encounter: Payer: Self-pay | Admitting: *Deleted

## 2015-01-04 ENCOUNTER — Ambulatory Visit: Payer: Medicare Other | Admitting: Anesthesiology

## 2015-01-04 ENCOUNTER — Encounter: Payer: Self-pay | Admitting: Anesthesiology

## 2015-01-04 ENCOUNTER — Ambulatory Visit
Admission: RE | Admit: 2015-01-04 | Discharge: 2015-01-04 | Disposition: A | Discharge status: Home / Self Care | Payer: Medicare Other | Source: Ambulatory Visit | Attending: Gastroenterology | Admitting: Gastroenterology

## 2015-01-04 ENCOUNTER — Encounter
Admission: RE | Disposition: A | Discharge status: Home / Self Care | Payer: Self-pay | Source: Ambulatory Visit | Attending: Gastroenterology

## 2015-01-04 DIAGNOSIS — Z8673 Personal history of transient ischemic attack (TIA), and cerebral infarction without residual deficits: Secondary | ICD-10-CM | POA: Insufficient documentation

## 2015-01-04 DIAGNOSIS — Z7901 Long term (current) use of anticoagulants: Secondary | ICD-10-CM | POA: Insufficient documentation

## 2015-01-04 DIAGNOSIS — K2950 Unspecified chronic gastritis without bleeding: Secondary | ICD-10-CM | POA: Diagnosis not present

## 2015-01-04 DIAGNOSIS — I739 Peripheral vascular disease, unspecified: Secondary | ICD-10-CM | POA: Insufficient documentation

## 2015-01-04 DIAGNOSIS — I10 Essential (primary) hypertension: Secondary | ICD-10-CM | POA: Insufficient documentation

## 2015-01-04 DIAGNOSIS — D509 Iron deficiency anemia, unspecified: Secondary | ICD-10-CM | POA: Diagnosis present

## 2015-01-04 DIAGNOSIS — D124 Benign neoplasm of descending colon: Secondary | ICD-10-CM | POA: Diagnosis not present

## 2015-01-04 DIAGNOSIS — D123 Benign neoplasm of transverse colon: Secondary | ICD-10-CM | POA: Diagnosis not present

## 2015-01-04 DIAGNOSIS — Z91040 Latex allergy status: Secondary | ICD-10-CM | POA: Insufficient documentation

## 2015-01-04 DIAGNOSIS — I4891 Unspecified atrial fibrillation: Secondary | ICD-10-CM | POA: Diagnosis not present

## 2015-01-04 DIAGNOSIS — K317 Polyp of stomach and duodenum: Secondary | ICD-10-CM | POA: Insufficient documentation

## 2015-01-04 DIAGNOSIS — E039 Hypothyroidism, unspecified: Secondary | ICD-10-CM | POA: Insufficient documentation

## 2015-01-04 DIAGNOSIS — Z888 Allergy status to other drugs, medicaments and biological substances status: Secondary | ICD-10-CM | POA: Insufficient documentation

## 2015-01-04 DIAGNOSIS — K621 Rectal polyp: Secondary | ICD-10-CM | POA: Diagnosis not present

## 2015-01-04 HISTORY — DX: Unspecified atrial fibrillation: I48.91

## 2015-01-04 HISTORY — DX: Transient cerebral ischemic attack, unspecified: G45.9

## 2015-01-04 HISTORY — PX: COLONOSCOPY: SHX5424

## 2015-01-04 HISTORY — PX: ESOPHAGOGASTRODUODENOSCOPY: SHX5428

## 2015-01-04 HISTORY — DX: Peripheral vascular disease, unspecified: I73.9

## 2015-01-04 HISTORY — DX: Hypothyroidism, unspecified: E03.9

## 2015-01-04 HISTORY — DX: Essential (primary) hypertension: I10

## 2015-01-04 LAB — HM COLONOSCOPY

## 2015-01-04 SURGERY — EGD (ESOPHAGOGASTRODUODENOSCOPY)
Anesthesia: General

## 2015-01-04 MED ORDER — PANTOPRAZOLE SODIUM 40 MG PO TBEC: 40.0000 mg | DELAYED_RELEASE_TABLET | Freq: Two times a day (BID) | ORAL | Status: AC

## 2015-01-04 MED ORDER — PROPOFOL INFUSION 10 MG/ML OPTIME
INTRAVENOUS | Status: DC | PRN
  Administered 2015-01-04: 120 ug/kg/min via INTRAVENOUS

## 2015-01-04 MED ORDER — PHENYLEPHRINE HCL 10 MG/ML IJ SOLN
INTRAMUSCULAR | Status: DC | PRN
  Administered 2015-01-04: 200 ug via INTRAVENOUS
  Administered 2015-01-04: 100 ug via INTRAVENOUS
  Administered 2015-01-04 (×3): 200 ug via INTRAVENOUS
  Administered 2015-01-04: 100 ug via INTRAVENOUS
  Administered 2015-01-04 (×9): 200 ug via INTRAVENOUS

## 2015-01-04 MED ORDER — SODIUM CHLORIDE 0.9 % IV SOLN
INTRAVENOUS | Status: DC
  Administered 2015-01-04: 1000 mL via INTRAVENOUS

## 2015-01-04 MED ORDER — FENTANYL CITRATE (PF) 100 MCG/2ML IJ SOLN
INTRAMUSCULAR | Status: DC | PRN
  Administered 2015-01-04: 50 ug via INTRAVENOUS

## 2015-01-04 MED ORDER — LABETALOL HCL 5 MG/ML IV SOLN
INTRAVENOUS | Status: DC | PRN
  Administered 2015-01-04: 10 mg via INTRAVENOUS

## 2015-01-04 MED ORDER — SODIUM CHLORIDE 0.9 % IV SOLN: INTRAVENOUS | Status: DC

## 2015-01-04 MED ORDER — PROPOFOL 10 MG/ML IV BOLUS
INTRAVENOUS | Status: DC | PRN
  Administered 2015-01-04: 40 mg via INTRAVENOUS

## 2015-01-04 NOTE — Anesthesia Postprocedure Evaluation (Signed)
  Anesthesia Post-op Note  Patient: Melissa Bass  Procedure(s) Performed: Procedure(s): ESOPHAGOGASTRODUODENOSCOPY (EGD) (N/A) COLONOSCOPY (N/A)  Anesthesia type:General  Patient location: PACU  Post pain: Pain level controlled  Post assessment: Post-op Vital signs reviewed, Patient's Cardiovascular Status Stable, Respiratory Function Stable, Patent Airway and No signs of Nausea or vomiting  Post vital signs: Reviewed and stable  Last Vitals:  Filed Vitals:   01/04/15 0734  BP: 183/104  Pulse: 92  Temp: 36.5 C  Resp: 15    Level of consciousness: awake, alert  and patient cooperative  Complications: No apparent anesthesia complications

## 2015-01-04 NOTE — Transfer of Care (Signed)
Immediate Anesthesia Transfer of Care Note  Patient: Melissa Bass  Procedure(s) Performed: Procedure(s): ESOPHAGOGASTRODUODENOSCOPY (EGD) (N/A) COLONOSCOPY (N/A)  Patient Location: PACU  Anesthesia Type:General  Level of Consciousness: sedated  Airway & Oxygen Therapy: Patient Spontanous Breathing and Patient connected to nasal cannula oxygen  Post-op Assessment: Report given to RN and Post -op Vital signs reviewed and stable  Post vital signs: Reviewed  Last Vitals:  Filed Vitals:   01/04/15 0734  BP: 183/104  Pulse: 92  Temp: 36.5 C  Resp: 15    Complications: No apparent anesthesia complications

## 2015-01-04 NOTE — Op Note (Signed)
[facility] Gastroenterology Patient Name: Melissa Bass Procedure Date: 01/04/2015 8:13 AM MRN: [id number] Account #: [id number] Date of Birth: Sep 09, 1940 Admit Type: Outpatient Age: 74 Room: [facility] ENDO ROOM 3 Gender: Female Note Status: Finalized Procedure:         Colonoscopy Indications:       Iron deficiency anemia Providers:         Lollie Sails, MD Referring MD:      Jerrell Belfast, MD (Referring MD) Medicines:         Monitored Anesthesia Care Complications:     No immediate complications. Procedure:         Pre-Anesthesia Assessment:                    - ASA Grade Assessment: III - A patient with severe                     systemic disease.                    After obtaining informed consent, the colonoscope was                     passed under direct vision. Throughout the procedure, the                     patient's blood pressure, pulse, and oxygen saturations                     were monitored continuously. The Olympus PCF-H180AL                     colonoscope ( S#: [id number] ) was introduced through the                     anus and advanced to the the cecum, identified by                     appendiceal orifice and ileocecal valve. The colonoscopy                     was performed with moderate difficulty due to a tortuous                     colon. Successful completion of the procedure was aided by                     using manual pressure. The patient tolerated the procedure                     well. The quality of the bowel preparation was good. Findings:      Two sessile polyps were found in the descending colon. The polyps were 2       to 3 mm in size. These polyps were removed with a cold biopsy forceps.       Resection and retrieval were complete.      A 4 mm polyp was found in the descending colon. The polyp was flat. The       polyp was removed with a cold snare. Resection and retrieval were       complete.      Two sessile polyps  were found at the hepatic flexure. The polyps were 2       to 3 mm in size. These polyps were  removed with a cold biopsy forceps.       Resection and retrieval were complete.      Two sessile polyps were found in the transverse colon. The polyps were 2       to 3 mm in size. These polyps were removed with a cold biopsy forceps.       Resection and retrieval were complete.      A 1 mm polyp was found in the rectum. The polyp was sessile. The polyp       was removed with a cold biopsy forceps. Resection and retrieval were       complete.      Multiple medium-mouthed diverticula were found in the sigmoid colon and       in the distal descending colon.      The digital rectal exam was normal. Impression:        - Two 2 to 3 mm polyps in the descending colon. Resected                     and retrieved.                    - One 4 mm polyp in the descending colon. Resected and                     retrieved.                    - Two 2 to 3 mm polyps at the hepatic flexure. Resected                     and retrieved.                    - Two 2 to 3 mm polyps in the transverse colon. Resected                     and retrieved.                    - One 1 mm polyp in the rectum. Resected and retrieved. Recommendation:    - Await pathology results. Procedure Code(s): --- Professional ---                    [phone], Colonoscopy, flexible; with removal of tumor(s),                     polyp(s), or other lesion(s) by snare technique                    45380, 12, Colonoscopy, flexible; with biopsy, single or                     multiple Diagnosis Code(s): --- Professional ---                    211.3, Benign neoplasm of colon                    569.0, Anal and rectal polyp                    280.9, Iron deficiency anemia, unspecified CPT copyright 2014 American Medical Association. All rights reserved. The codes documented in this report are preliminary and upon coder review may  be revised to meet  current compliance requirements. Billie Ruddy  Gustavo Lah, MD 01/04/2015 9:34:37 AM This report has been signed electronically. Number of Addenda: 0 Note Initiated On: 01/04/2015 8:13 AM Scope Withdrawal Time: 0 hours 18 minutes 6 seconds  Total Procedure Duration: 0 hours 34 minutes 6 seconds       [facility]

## 2015-01-04 NOTE — H&P (Signed)
Outpatient short stay form Pre-procedure 01/04/2015 8:04 AM Lollie Sails MD  Primary Physician: Margarita Rana number M.D. Serafina Royals, M.D. Wallene Huh, M.D.  Reason for visit:  Deficiency anemia  History of present illness:  Patient is a 74 year old Caucasian female who is presenting today for EGD and colonoscopy in regards to recent finding of iron deficiency. A history of atrial fibrillation and is admitted to the hospital on 11/12/2014 with shortness of breath and weakness. He was found to have a hemoglobin of 6.7 was transfused. However at same time she had a pneumonia and it was not possible to proceed with luminal evaluation during her hospitalization. He has now been off of ELIQUIS    Current facility-administered medications:  .  0.9 %  sodium chloride infusion, , Intravenous, Continuous, Lollie Sails, MD, Last Rate: 10 mL/hr at 01/04/15 0752, 1,000 mL at 01/04/15 0752   Allergies  Allergen Reactions  . Colchicine     RASH.  . Diltiazem Hives  . Ezetimibe   . Ezetimibe-Simvastatin   . Rosuvastatin     muscle aches.  . Simvastatin   . Allopurinol Hives and Rash  . Latex Rash     Past Medical History  Diagnosis Date  . Hypertension   . Hypothyroidism   . Atrial fibrillation   . Peripheral vascular disease   . TIA (transient ischemic attack)     Review of systems:      Physical Exam    Heart and lungs: Irregularly irregular rhythm, there to auscultation    HEENT: Normocephalic atraumatic    Other:     Pertinant exam for procedure: Soft nontender nondistended bowel sounds positive normoactive    Planned proceedures: EGD and colonoscopy. I discussed the risks benefits, occasions a procedure to include not limited to bleeding infection perforation and the risk of sedation and she wishes to proceed.    Lollie Sails, MD Gastroenterology 01/04/2015  8:04 AM

## 2015-01-04 NOTE — Anesthesia Preprocedure Evaluation (Addendum)
Anesthesia Evaluation  Patient identified by MRN, date of birth, ID band Patient awake    Reviewed: Allergy & Precautions, NPO status , Patient's Chart, lab work & pertinent test results  Airway Mallampati: II  TM Distance: >3 FB Neck ROM: Full    Dental  (+) Upper Dentures, Lower Dentures   Pulmonary former smoker (quit x 20 yrs),          Cardiovascular hypertension, Pt. on medications and Pt. on home beta blockers + Peripheral Vascular Disease + dysrhythmias Atrial Fibrillation     Neuro/Psych TIA (Facial droop, resolved)   GI/Hepatic   Endo/Other  Hypothyroidism   Renal/GU      Musculoskeletal   Abdominal   Peds  Hematology   Anesthesia Other Findings   Reproductive/Obstetrics                            Anesthesia Physical Anesthesia Plan  ASA: III  Anesthesia Plan: General   Post-op Pain Management:    Induction: Intravenous  Airway Management Planned: Nasal Cannula  Additional Equipment:   Intra-op Plan:   Post-operative Plan:   Informed Consent: I have reviewed the patients History and Physical, chart, labs and discussed the procedure including the risks, benefits and alternatives for the proposed anesthesia with the patient or authorized representative who has indicated his/her understanding and acceptance.     Plan Discussed with:   Anesthesia Plan Comments:         Anesthesia Quick Evaluation

## 2015-01-04 NOTE — Op Note (Signed)
[facility] Gastroenterology Patient Name: Melissa Bass Procedure Date: 01/04/2015 8:14 AM MRN: [id number] Account #: [id number] Date of Birth: 07-Jan-1941 Admit Type: Outpatient Age: 74 Room: [facility] ENDO ROOM 3 Gender: Female Note Status: Finalized Procedure:         Upper GI endoscopy Indications:       Iron deficiency anemia Providers:         Lollie Sails, MD Referring MD:      Jerrell Belfast, MD (Referring MD) Medicines:         Monitored Anesthesia Care Complications:     No immediate complications. Procedure:         Pre-Anesthesia Assessment:                    - ASA Grade Assessment: III - A patient with severe                     systemic disease.                    After obtaining informed consent, the endoscope was passed                     under direct vision. Throughout the procedure, the                     patient's blood pressure, pulse, and oxygen saturations                     were monitored continuously. The Endoscope was introduced                     through the mouth, and advanced to the third part of                     duodenum. The upper GI endoscopy was performed with                     moderate difficulty due to a J-shaped stomach which made                     pyloric intubation difficult. Findings:      The Z-line was irregular. Biopsies were taken with a cold forceps for       histology.      Diffuse moderate inflammation characterized by congestion (edema),       erythema, friability and granularity was found in the gastric body and       in the gastric antrum. Possible multiple smal antral avms. Biopsies were       taken with a cold forceps for histology. Biopsies were taken with a cold       forceps for Helicobacter pylori testing.      A single 10 mm pedunculated polyp with no bleeding and no stigmata of       recent bleeding was found at the incisura. Biopsies were taken with a       cold forceps for histology. In  the J shaped stomach the polyp is located       on the distal side of the incisura, difficult to approach for       polypectomy.      The cardia and gastric fundus were normal on retroflexion otherwise.      The examined duodenum was normal. Impression:        -  Z-line irregular. Biopsied.                    - Gastritis. Biopsied.                    - A single gastric polyp. Biopsied.                    - Normal examined duodenum. Recommendation:    - Perform a colonoscopy today.                    - Await pathology results.                    - treat with ppi to decrease inflammation prior to repeat. Procedure Code(s): --- Professional ---                    [phone], Esophagogastroduodenoscopy, flexible, transoral;                     with biopsy, single or multiple Diagnosis Code(s): --- Professional ---                    530.89, Other specified disorders of esophagus                    535.50, Unspecified gastritis and gastroduodenitis,                     without mention of hemorrhage                    211.1, Benign neoplasm of stomach                    280.9, Iron deficiency anemia, unspecified CPT copyright 2014 American Medical Association. All rights reserved. The codes documented in this report are preliminary and upon coder review may  be revised to meet current compliance requirements. Lollie Sails, MD 01/04/2015 8:51:42 AM This report has been signed electronically. Number of Addenda: 0 Note Initiated On: 01/04/2015 8:14 AM      [facility]

## 2015-01-05 ENCOUNTER — Encounter: Payer: Self-pay | Admitting: Gastroenterology

## 2015-01-06 LAB — SURGICAL PATHOLOGY

## 2015-01-12 DIAGNOSIS — I872 Venous insufficiency (chronic) (peripheral): Secondary | ICD-10-CM | POA: Insufficient documentation

## 2015-01-12 DIAGNOSIS — J984 Other disorders of lung: Secondary | ICD-10-CM | POA: Insufficient documentation

## 2015-01-12 DIAGNOSIS — D649 Anemia, unspecified: Secondary | ICD-10-CM | POA: Insufficient documentation

## 2015-01-12 DIAGNOSIS — L409 Psoriasis, unspecified: Secondary | ICD-10-CM | POA: Insufficient documentation

## 2015-01-12 DIAGNOSIS — R0902 Hypoxemia: Secondary | ICD-10-CM | POA: Insufficient documentation

## 2015-01-12 DIAGNOSIS — J309 Allergic rhinitis, unspecified: Secondary | ICD-10-CM | POA: Insufficient documentation

## 2015-01-12 DIAGNOSIS — R739 Hyperglycemia, unspecified: Secondary | ICD-10-CM | POA: Insufficient documentation

## 2015-01-12 DIAGNOSIS — C4491 Basal cell carcinoma of skin, unspecified: Secondary | ICD-10-CM | POA: Insufficient documentation

## 2015-01-12 DIAGNOSIS — I130 Hypertensive heart and chronic kidney disease with heart failure and stage 1 through stage 4 chronic kidney disease, or unspecified chronic kidney disease: Secondary | ICD-10-CM | POA: Insufficient documentation

## 2015-01-12 DIAGNOSIS — C801 Malignant (primary) neoplasm, unspecified: Secondary | ICD-10-CM | POA: Insufficient documentation

## 2015-01-12 DIAGNOSIS — N952 Postmenopausal atrophic vaginitis: Secondary | ICD-10-CM | POA: Insufficient documentation

## 2015-01-12 DIAGNOSIS — E538 Deficiency of other specified B group vitamins: Secondary | ICD-10-CM | POA: Insufficient documentation

## 2015-01-12 DIAGNOSIS — N289 Disorder of kidney and ureter, unspecified: Secondary | ICD-10-CM | POA: Insufficient documentation

## 2015-01-12 DIAGNOSIS — I739 Peripheral vascular disease, unspecified: Secondary | ICD-10-CM | POA: Insufficient documentation

## 2015-01-12 DIAGNOSIS — E785 Hyperlipidemia, unspecified: Secondary | ICD-10-CM | POA: Insufficient documentation

## 2015-01-12 DIAGNOSIS — E039 Hypothyroidism, unspecified: Secondary | ICD-10-CM | POA: Insufficient documentation

## 2015-01-12 DIAGNOSIS — I480 Paroxysmal atrial fibrillation: Secondary | ICD-10-CM | POA: Insufficient documentation

## 2015-01-12 DIAGNOSIS — E875 Hyperkalemia: Secondary | ICD-10-CM | POA: Insufficient documentation

## 2015-01-12 DIAGNOSIS — G4700 Insomnia, unspecified: Secondary | ICD-10-CM | POA: Insufficient documentation

## 2015-01-21 ENCOUNTER — Encounter: Payer: Self-pay | Admitting: Family Medicine

## 2015-01-21 ENCOUNTER — Other Ambulatory Visit: Payer: Self-pay | Admitting: Gastroenterology

## 2015-01-21 ENCOUNTER — Ambulatory Visit (INDEPENDENT_AMBULATORY_CARE_PROVIDER_SITE_OTHER): Payer: Medicare Other | Admitting: Family Medicine

## 2015-01-21 VITALS — BP 158/98 | HR 96 | Temp 98.1°F | Resp 20 | Ht 66.0 in | Wt 193.0 lb

## 2015-01-21 DIAGNOSIS — D509 Iron deficiency anemia, unspecified: Secondary | ICD-10-CM

## 2015-01-21 DIAGNOSIS — I482 Chronic atrial fibrillation, unspecified: Secondary | ICD-10-CM

## 2015-01-21 DIAGNOSIS — J439 Emphysema, unspecified: Secondary | ICD-10-CM | POA: Diagnosis not present

## 2015-01-21 DIAGNOSIS — I10 Essential (primary) hypertension: Secondary | ICD-10-CM | POA: Diagnosis not present

## 2015-01-21 MED ORDER — TIOTROPIUM BROMIDE MONOHYDRATE 18 MCG IN CAPS: 1.0000 | ORAL_CAPSULE | Freq: Every day | RESPIRATORY_TRACT | Status: AC

## 2015-01-21 MED ORDER — TIOTROPIUM BROMIDE MONOHYDRATE 18 MCG IN CAPS: 1.0000 | ORAL_CAPSULE | Freq: Every day | RESPIRATORY_TRACT | Status: DC

## 2015-01-21 MED ORDER — ALBUTEROL SULFATE HFA 108 (90 BASE) MCG/ACT IN AERS: 2.0000 | INHALATION_SPRAY | Freq: Four times a day (QID) | RESPIRATORY_TRACT | Status: AC | PRN

## 2015-01-21 NOTE — Progress Notes (Signed)
Subjective:     Patient ID: Melissa Bass, female   DOB: 1940-11-29, 74 y.o.   MRN: [id number]  Atrial Fibrillation Presents for follow-up visit. Symptoms include hypertension, palpitations (History of A-Fib) and shortness of breath (History of COPD). Symptoms are negative for bradycardia, chest pain, dizziness and weakness. The symptoms have been stable. Past medical history includes atrial fibrillation and HTN.  Hypertension This is a chronic problem. The problem is unchanged (Pt says her blood pressure runs high at doctor's appointments, but does not check it at home.). Associated symptoms include palpitations (History of A-Fib) and shortness of breath (History of COPD). Pertinent negatives include no chest pain, headaches or neck pain.  Pneumonia She complains of shortness of breath (History of COPD). There is no cough or wheezing. Pertinent negatives include no appetite change, chest pain, fever, headaches or myalgias.     Review of Systems  Constitutional: Negative for fever, chills, diaphoresis, activity change, appetite change, fatigue and unexpected weight change.  Respiratory: Positive for shortness of breath (History of COPD). Negative for cough and wheezing.   Cardiovascular: Positive for palpitations (History of A-Fib) and leg swelling. Negative for chest pain.  Gastrointestinal: Negative for nausea, vomiting, abdominal pain, diarrhea, constipation, blood in stool and anal bleeding.  Musculoskeletal: Positive for arthralgias (History of Arthritis). Negative for myalgias, back pain, joint swelling, gait problem, neck pain and neck stiffness.  Neurological: Negative for dizziness, weakness, light-headedness, numbness and headaches.    Family History  Problem Relation Age of Onset  . Stroke Mother   . Alzheimer's disease Mother   . Stroke Father   . Breast cancer Sister   . Cancer Brother   . Colon cancer Brother   . Alzheimer's disease Brother      History   Social  History  . Marital Status: Married    Spouse Name: N/A  . Number of Children: N/A  . Years of Education: N/A   Occupational History  . Not on file.   Social History Main Topics  . Smoking status: Former Smoker    Types: Cigarettes    Quit date: 08/21/1994  . Smokeless tobacco: Never Used  . Alcohol Use: 0.6 oz/week    1 Glasses of wine per week  . Drug Use: No  . Sexual Activity: Not on file   Other Topics Concern  . Not on file   Social History Narrative     Past Surgical History  Procedure Laterality Date  . Thyroidectomy    . ? artery endarterectomy    . Breast biopsy    . Esophagogastroduodenoscopy N/A 01/04/2015    Procedure: ESOPHAGOGASTRODUODENOSCOPY (EGD);  Surgeon: Lollie Sails, MD;  Location: [facility] ENDOSCOPY;  Service: Endoscopy;  Laterality: N/A;  . Colonoscopy N/A 01/04/2015    Procedure: COLONOSCOPY;  Surgeon: Lollie Sails, MD;  Location: [facility] ENDOSCOPY;  Service: Endoscopy;  Laterality: N/A;  . Breast biopsy Left 1980    Benign.  Marland Kitchen Appendectomy  1980  . Abdominal hysterectomy  1980     Ovaries Removed; Fibroids.  . Total thyroidectomy  1978    Benign nodules/mutlinodular goiter.    Allergies  Allergen Reactions  . Colchicine     RASH.  . Diltiazem Hives  . Ezetimibe   . Ezetimibe-Simvastatin   . Rosuvastatin     muscle aches.  . Simvastatin   . Allopurinol Hives and Rash  . Latex Rash   Current Outpatient Prescriptions on File Prior to Visit  Medication Sig Dispense Refill  .  atorvastatin (LIPITOR) 20 MG tablet Take 1 tablet by mouth daily.    . cyanocobalamin 1000 MCG tablet Take 100 mcg by mouth daily.     . ferrous sulfate 325 (65 FE) MG tablet Take 1 tablet by mouth daily.    . fluticasone (FLONASE) 50 MCG/ACT nasal spray Place 2 sprays into the nose daily as needed for allergies.     Marland Kitchen levothyroxine (SYNTHROID, LEVOTHROID) 150 MCG tablet Take 1 tablet by mouth daily.    Marland Kitchen MAGNESIUM OXIDE, ANTACID, PO Take 1 tablet by mouth  daily.    . metoprolol succinate (TOPROL-XL) 100 MG 24 hr tablet Take 25 mg by mouth daily.     . pantoprazole (PROTONIX) 40 MG tablet Take 1 tablet (40 mg total) by mouth 2 (two) times daily. Take one half hour before lunch and before late supper 60 tablet 3  . telmisartan (MICARDIS) 80 MG tablet Take 80 mg by mouth daily.     No current facility-administered medications on file prior to visit.    Blood pressure 158/98, pulse 96, temperature 98.1 F (36.7 C), temperature source Oral, resp. rate 20, height 5\' 6"  (1.676 m), weight 193 lb (87.544 kg).       Objective:   Physical Exam  Constitutional: She appears well-developed and well-nourished.  Cardiovascular: Normal rate and normal heart sounds.  An irregularly irregular rhythm present.  Pulmonary/Chest: Effort normal. She has decreased breath sounds.  Neurological: She is alert.  Psychiatric: She has a normal mood and affect. Judgment normal.       Assessment:    See below.     Plan:  1. Pulmonary emphysema, unspecified emphysema  New diagnosis for patient. Did not get meds filled after hospitalization. Will restart and refer to pulm.  (Proair also sent in) - tiotropium (SPIRIVA HANDIHALER) 18 MCG inhalation capsule; Place 1 capsule (18 mcg total) into inhaler and inhale daily.  Dispense: 30 capsule; Refill: 5 - Ambulatory referral to Pulmonology  2. Benign essential HTN Condition is stable. Please continue current medication and  plan of care as noted.    3. Atrial fibrillation, chronic Condition is stable. Please continue current medication and  plan of care as noted.     Patient was seen and examined by Jerrell Belfast, MD, and note scribed by Ashley Royalty, CMA.  I have reviewed the document for accuracy and completeness and I agree with above. Jerrell Belfast, MD

## 2015-01-25 ENCOUNTER — Ambulatory Visit: Payer: Medicare Other

## 2015-01-27 ENCOUNTER — Ambulatory Visit: Admission: RE | Admit: 2015-01-27 | Payer: Medicare Other | Source: Ambulatory Visit

## 2015-07-09 IMAGING — US US BREAST BILAT
1 series · 14 of 25 positions shown · non-contrast
Comparison: none

[Series 1: us breast bilat · 14 of 49 slices shown]
[im 1/49]
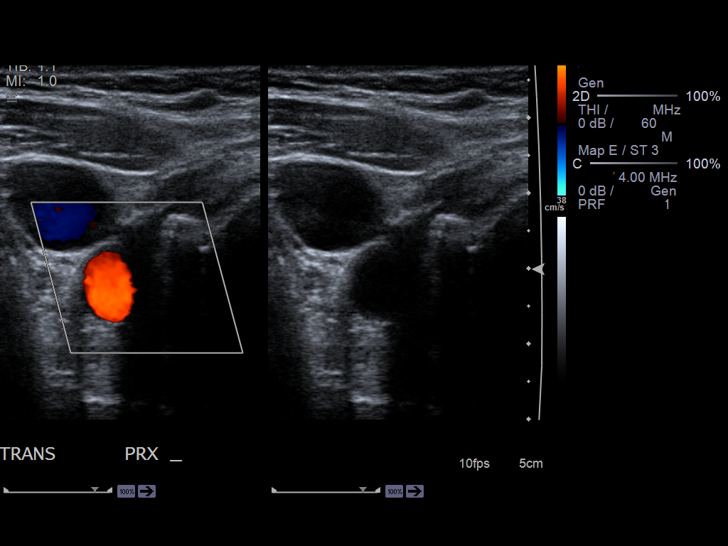
[im 5/49]
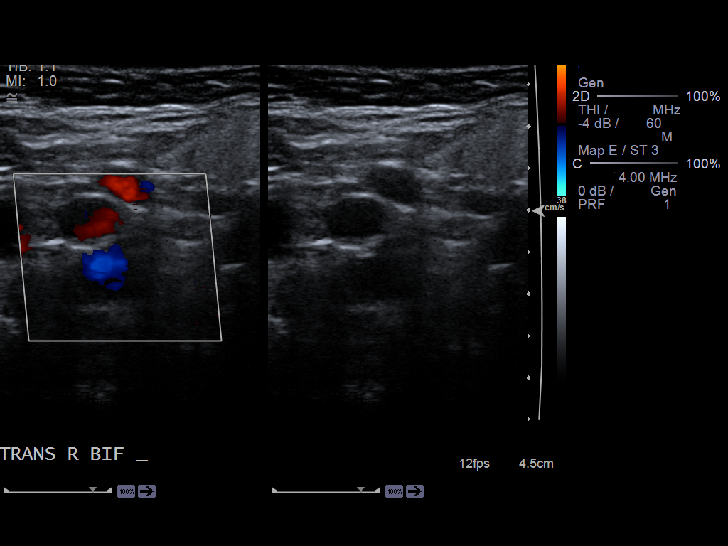
[im 9/49]
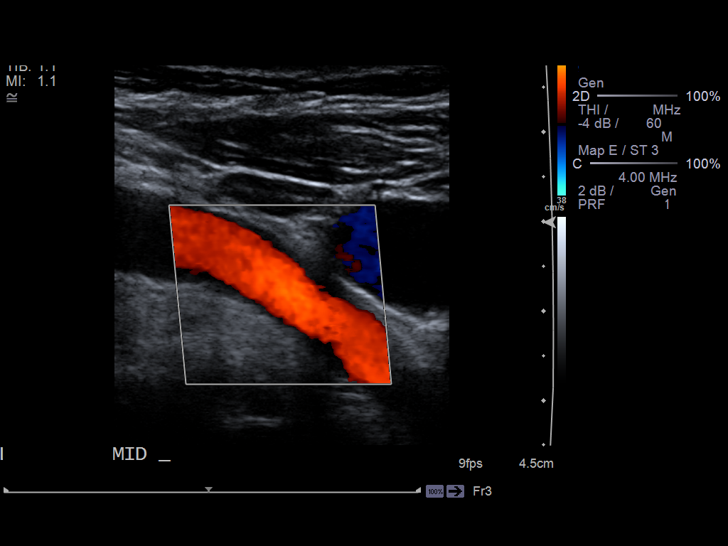
[im 13/49]
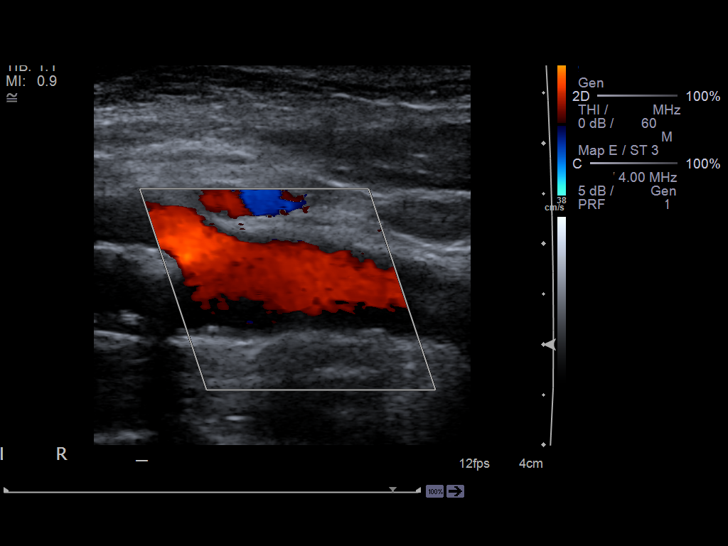
[im 17/49]
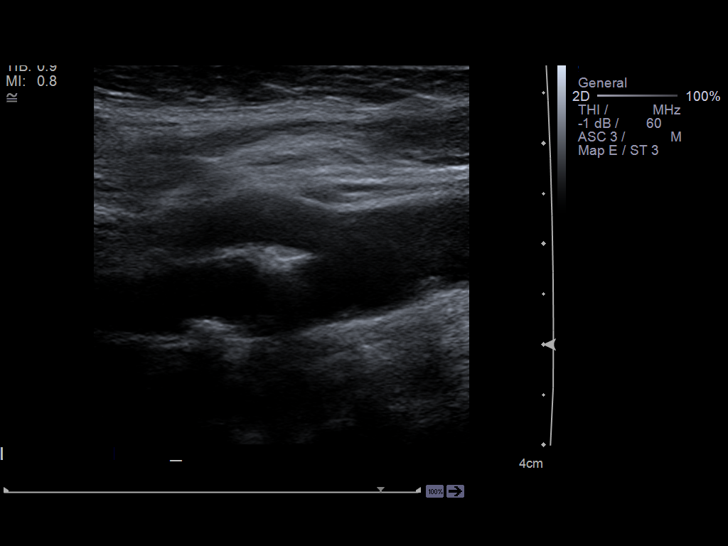
[im 19/49]
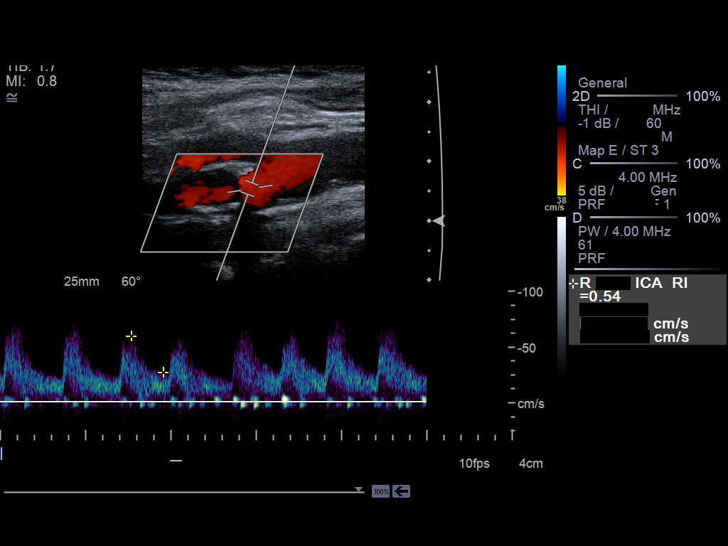
[im 23/49]
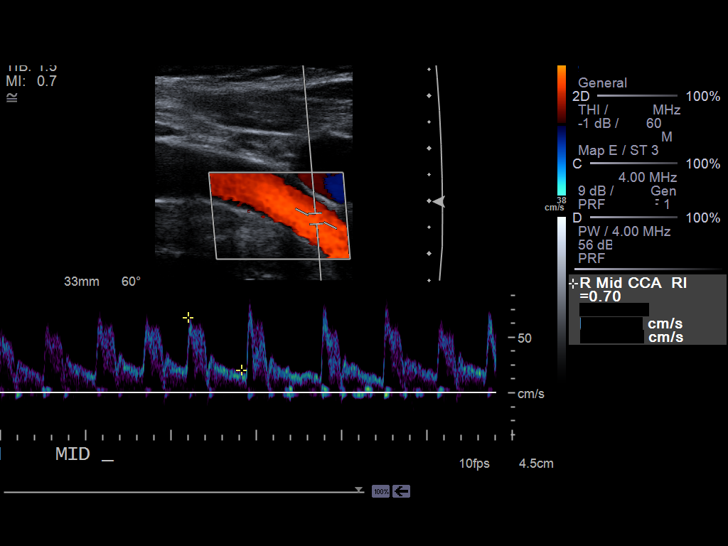
[im 27/49]
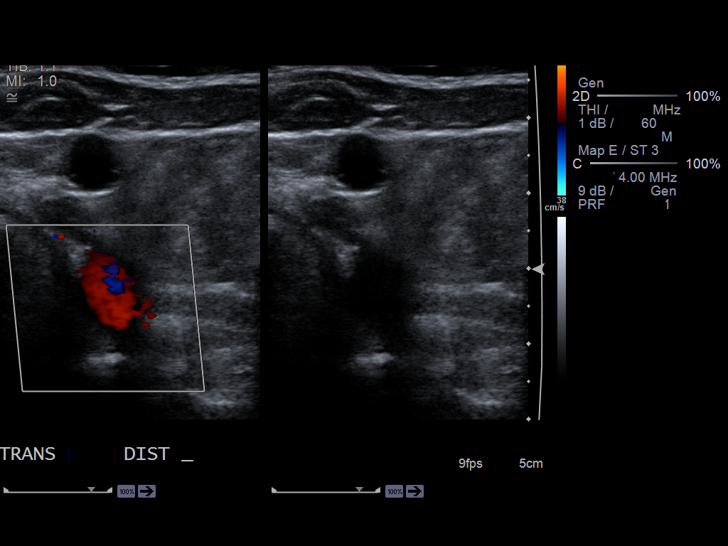
[im 31/49]
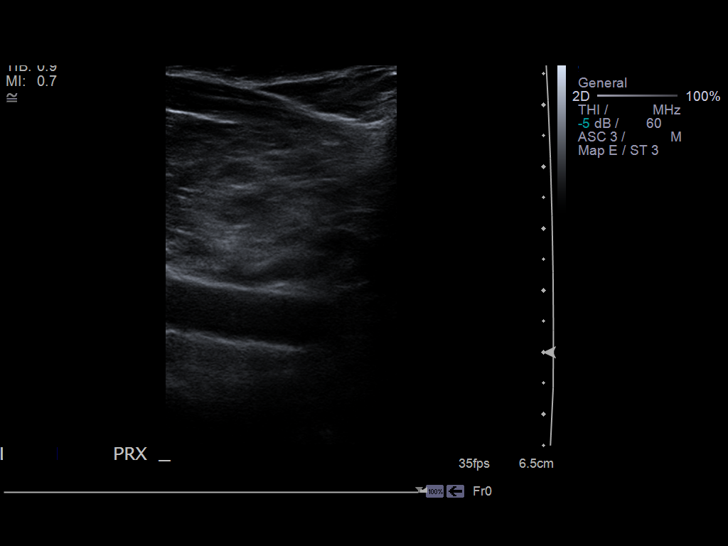
[im 33/49]
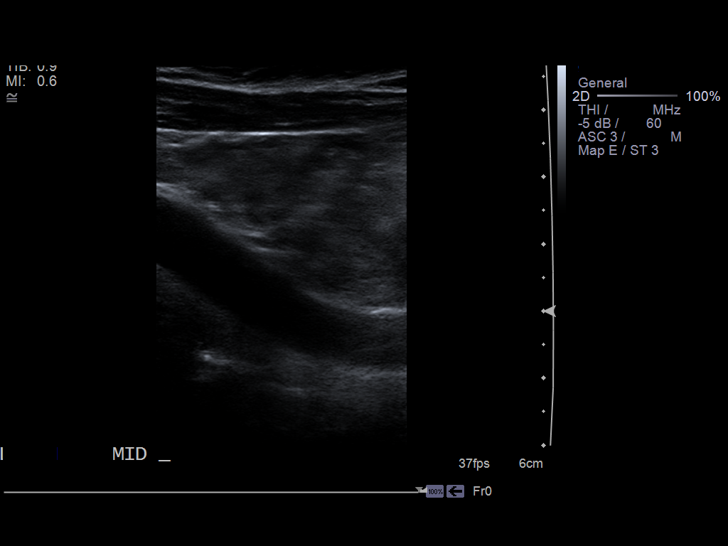
[im 37/49]
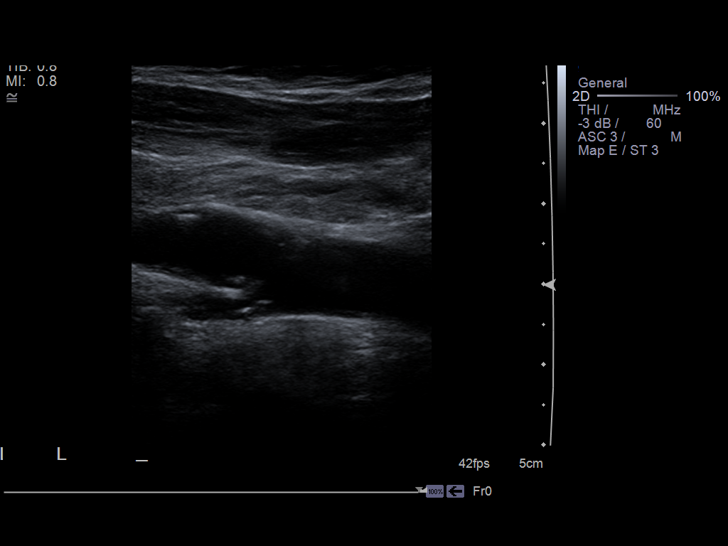
[im 41/49]
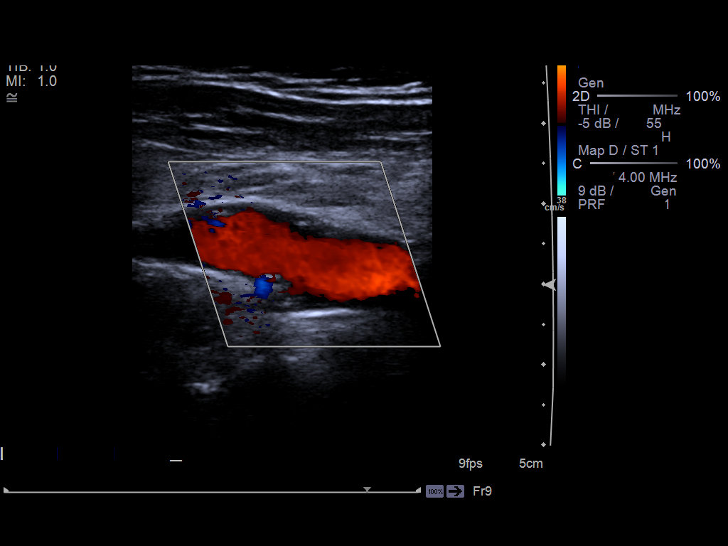
[im 45/49]
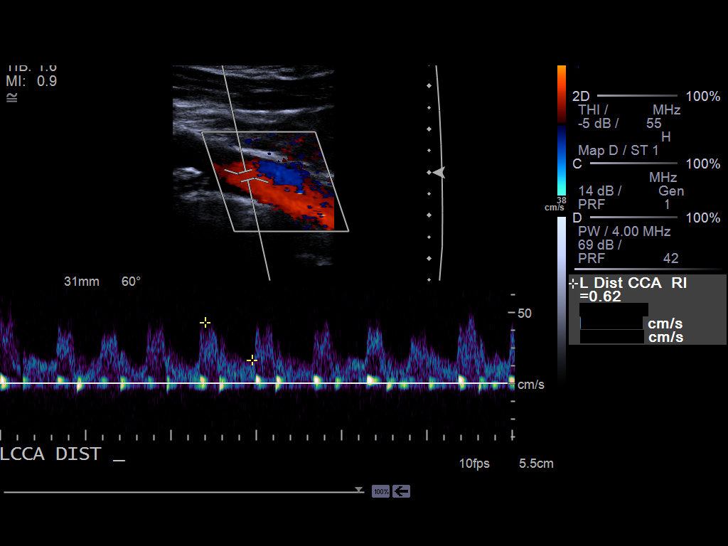
[im 49/49]
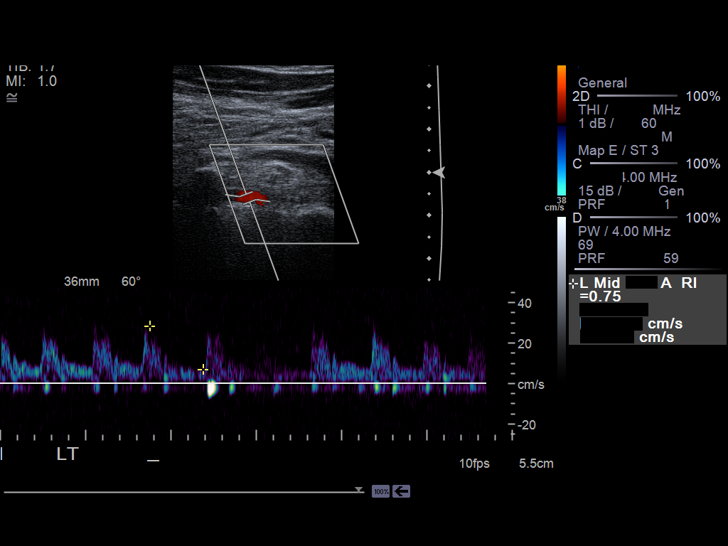

[14 of 25 positions shown; findings below may reference images not displayed]

Canned report from images found in remote index.

Refer to host system for actual result text.

## 2016-02-28 IMAGING — CR DG CHEST 1V PORT
1 series · 1 of 1 positions shown · non-contrast
Comparison: None.

CLINICAL DATA: Shortness of Breath for several weeks

EXAM:
PORTABLE CHEST - 1 VIEW

[ap]
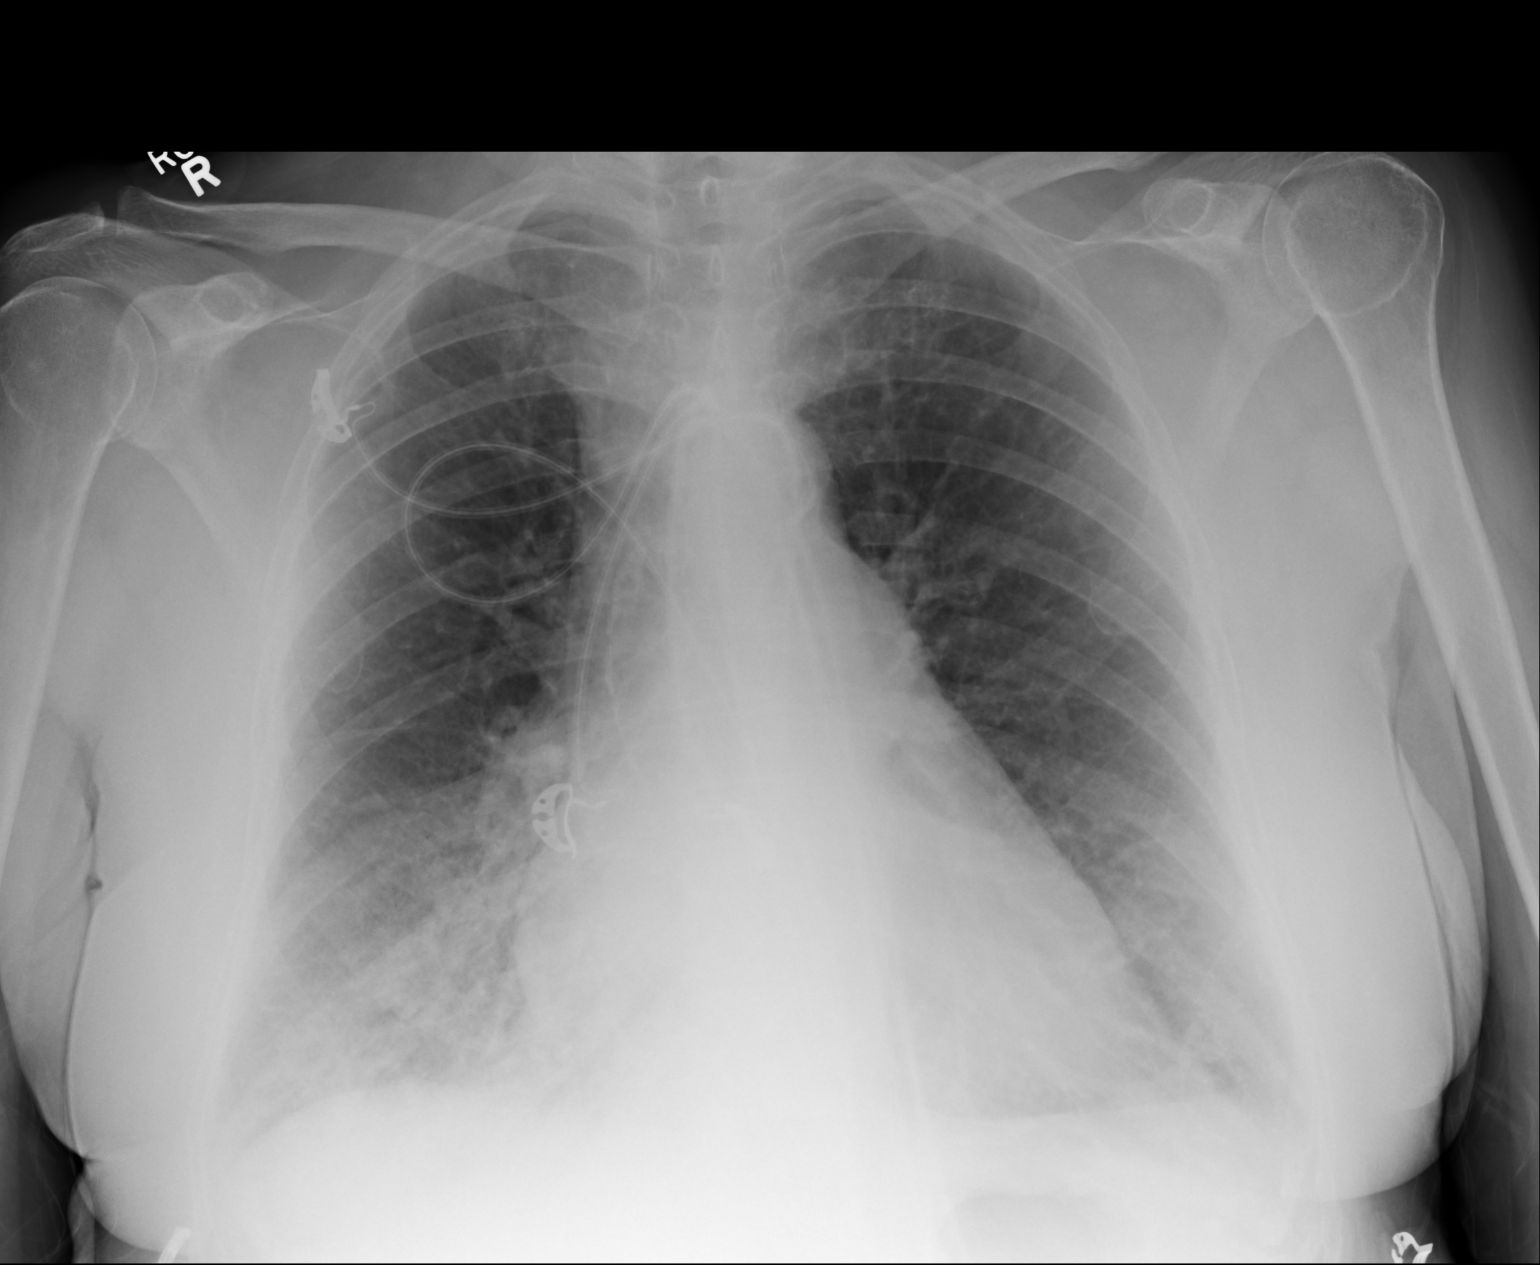

[1 of 1 positions shown; findings below may reference images not displayed]

FINDINGS: Cardiac shadow is at the upper limits of normal in size. Increased
density is noted in the right lung base likely representing a right
lower lobe infiltrate. No sizable effusion is seen.
IMPRESSION: Changes consistent with right lower lobe infiltrate.

## 2016-02-29 IMAGING — CR DG CHEST 2V
1 series · 2 of 2 positions shown · non-contrast
Comparison: 11/12/2014.

CLINICAL DATA: Shortness of breath.  Follow-up pneumonia.

EXAM:
CHEST  2 VIEW

[Series 1: dxr chest pa (or ap) and lateral · 0.14mm/px · 2 of 2 slices shown]
[im 1/2]
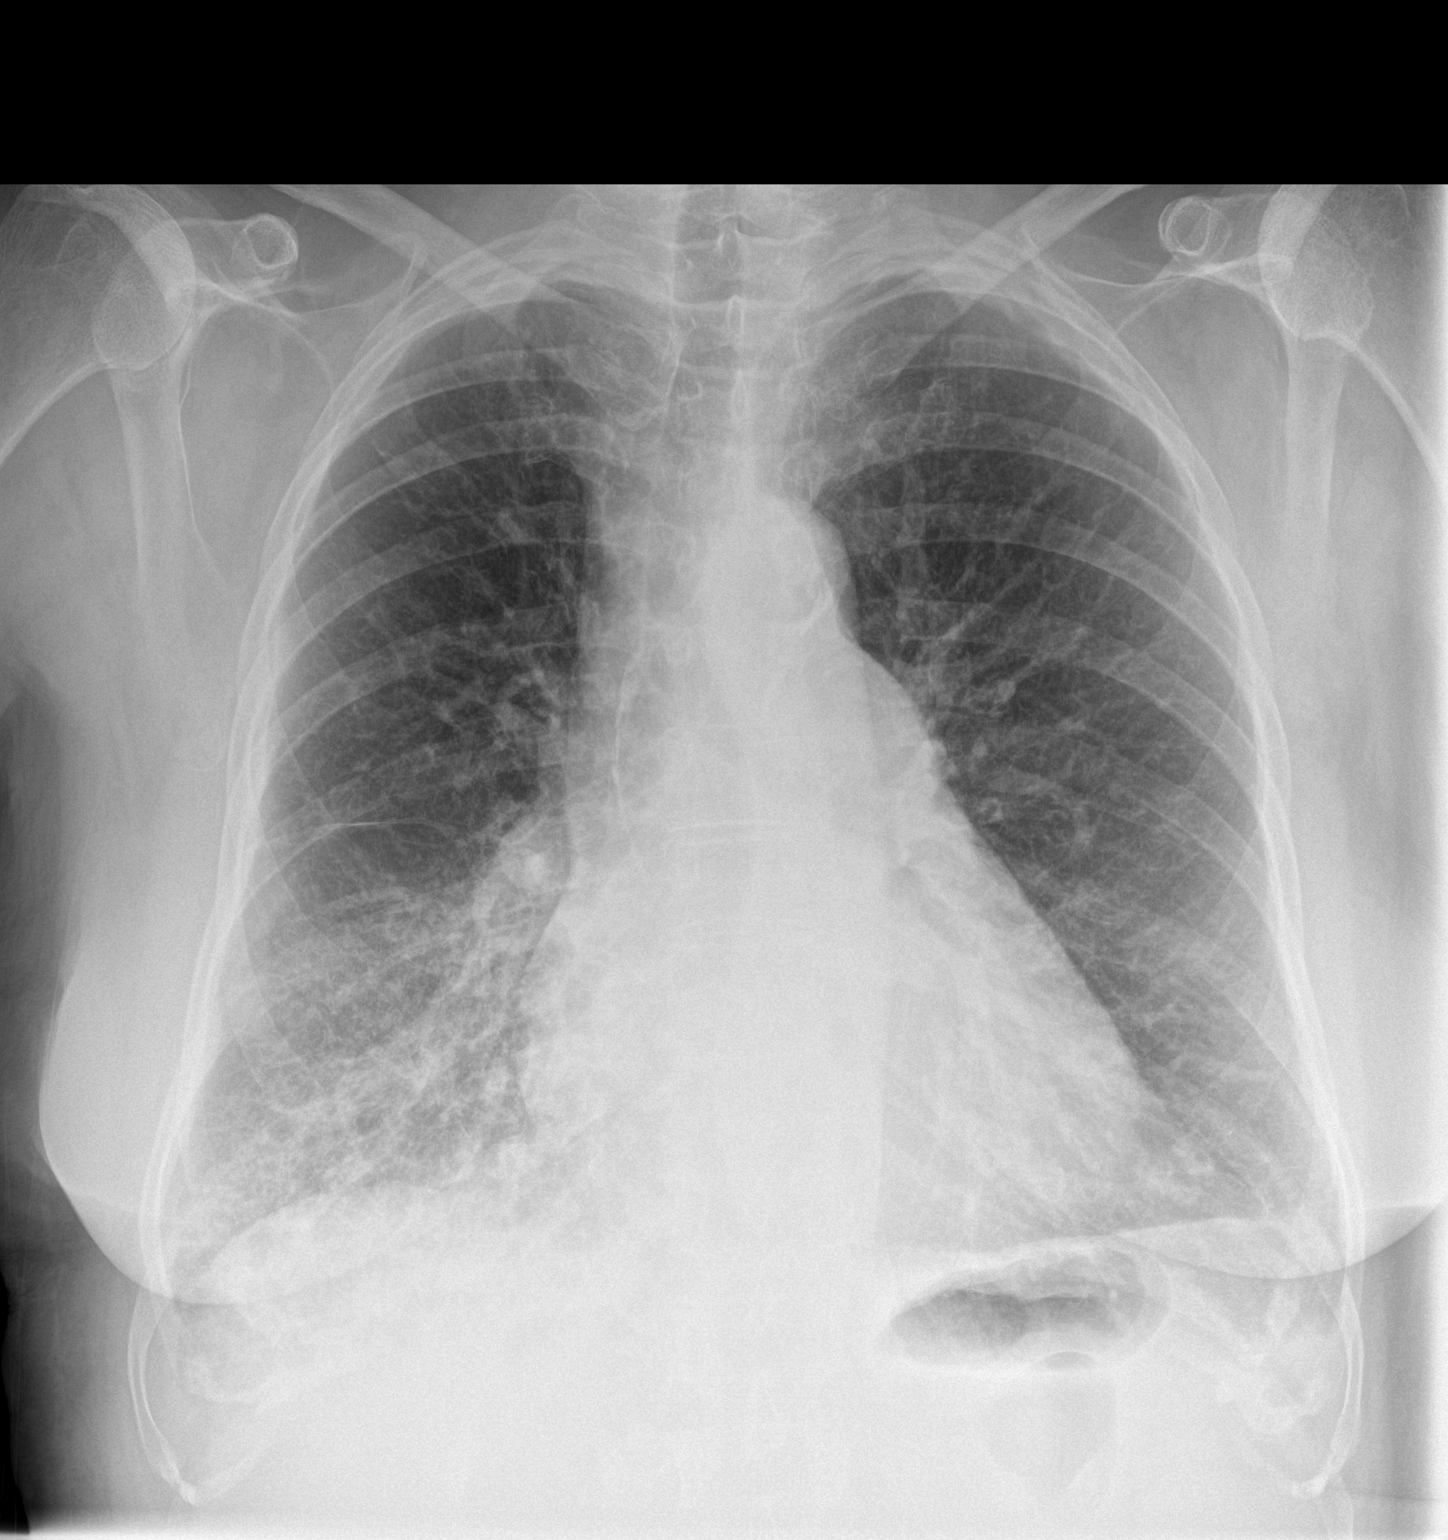
[im 2/2]
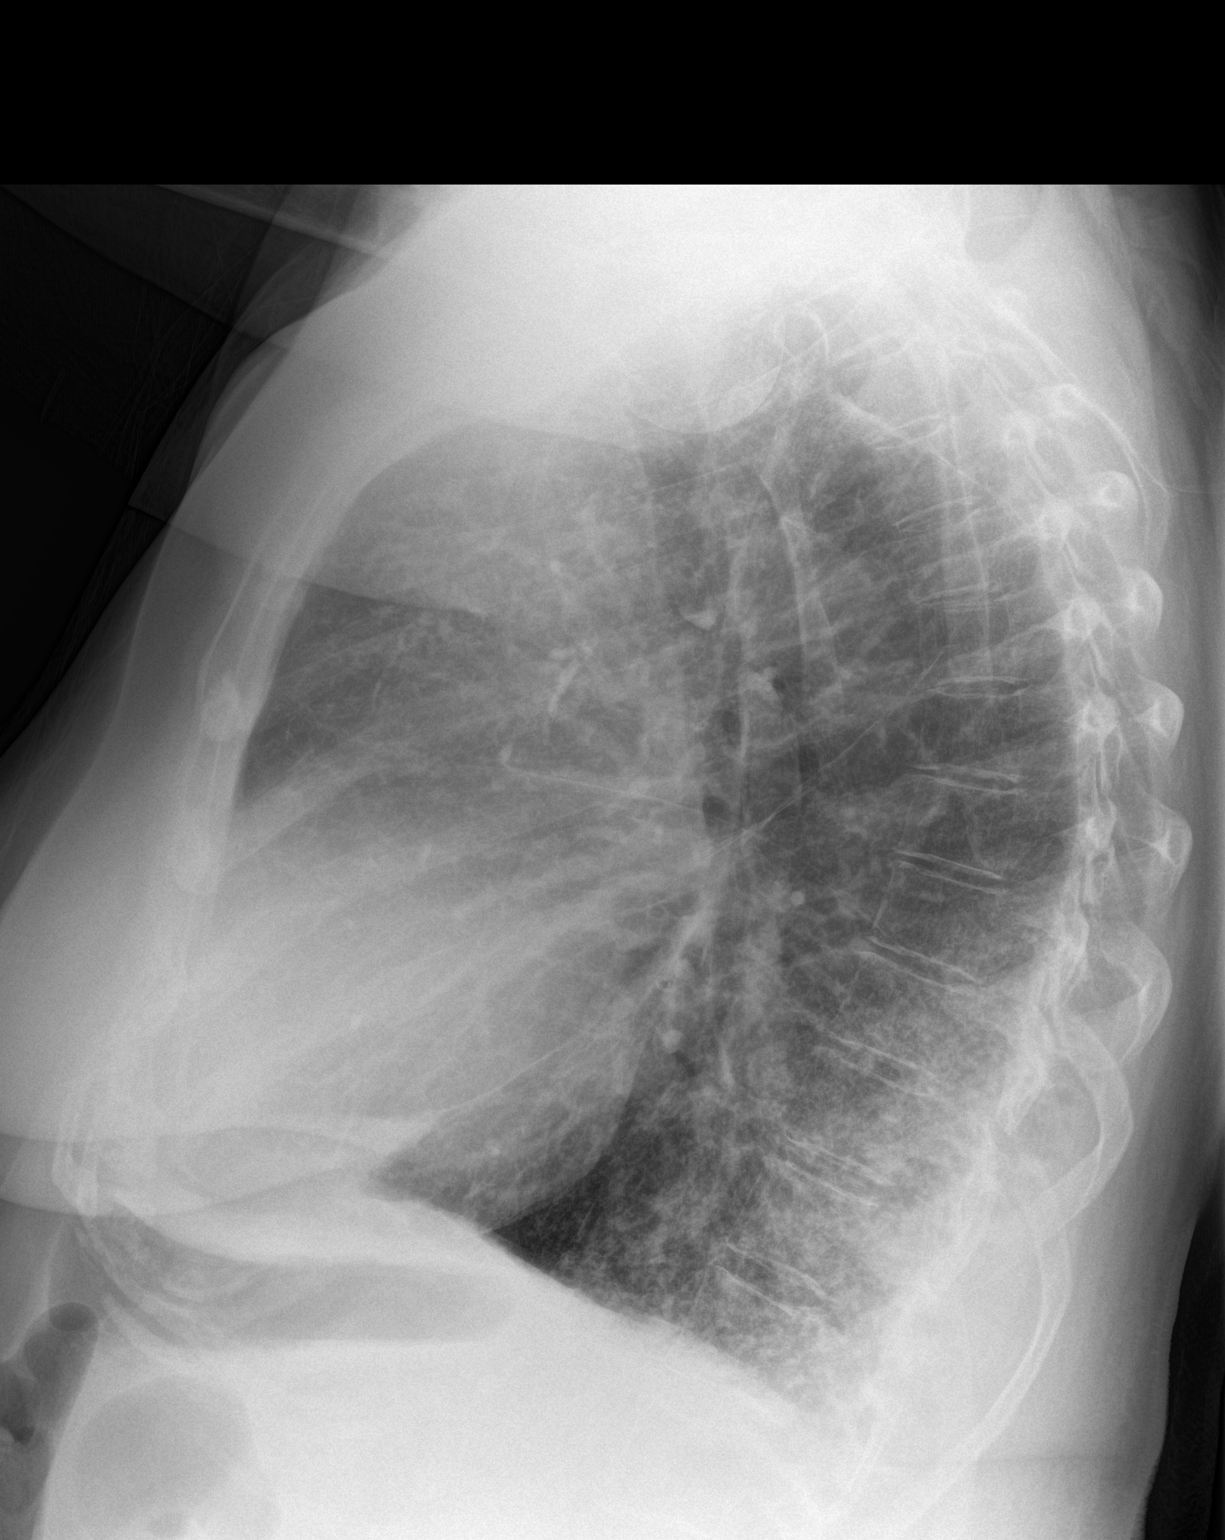

[2 of 2 positions shown; findings below may reference images not displayed]

FINDINGS: The heart is at the upper limits of normal in size. Calcification of
the thoracic aorta again noted. Patchy infiltrates in both lower
lobes are present, right more than left, consistent with
bronchopneumonia. The upper lungs are clear. No measurable effusion.
No focal lesion otherwise. No significant bony finding.
IMPRESSION: Persistent lower lobe pneumonia right worse than left. No dense
consolidation or lobar collapse. No progression or new finding.

## 2016-11-29 ENCOUNTER — Telehealth: Payer: Self-pay | Admitting: Family Medicine

## 2016-11-29 NOTE — Telephone Encounter (Signed)
Called Pt to schedule AWV with [facility] - knb °
